# Patient Record
Sex: Female | Born: 1985 | Race: White | Hispanic: No | Marital: Married | Smoking: Never smoker
Health system: Southern US, Community
[De-identification: ages and names within clinical notes are randomized; demographics above are authoritative.]

## PROBLEM LIST (undated history)

## (undated) ENCOUNTER — Inpatient Hospital Stay (HOSPITAL_COMMUNITY): Payer: Self-pay

## (undated) DIAGNOSIS — Z789 Other specified health status: Secondary | ICD-10-CM

## (undated) DIAGNOSIS — R87629 Unspecified abnormal cytological findings in specimens from vagina: Secondary | ICD-10-CM

## (undated) HISTORY — PX: WISDOM TOOTH EXTRACTION: SHX21

## (undated) HISTORY — PX: OTHER SURGICAL HISTORY: SHX169

## (undated) HISTORY — PX: TONSILLECTOMY: SUR1361

---

## 2010-03-01 ENCOUNTER — Encounter: Payer: Self-pay | Admitting: Emergency Medicine

## 2010-03-01 ENCOUNTER — Ambulatory Visit (INDEPENDENT_AMBULATORY_CARE_PROVIDER_SITE_OTHER): Payer: BC Managed Care – PPO | Admitting: Emergency Medicine

## 2010-03-01 DIAGNOSIS — J069 Acute upper respiratory infection, unspecified: Secondary | ICD-10-CM

## 2010-03-12 NOTE — Assessment & Plan Note (Signed)
Summary: sinus/TM(rm3)   Vital Signs:  Patient Profile:   25 Years Old Female CC:      sinus/ear pain Height:     67 inches Weight:      190.5 pounds O2 Sat:      97 % O2 treatment:    Room Air Temp:     97.3 degrees F oral Pulse rate:   83 / minute Resp:     20 per minute BP sitting:   115 / 74  (left arm) Cuff size:   regular  Vitals Entered By: Burnard Hawthorne RN (March 01, 2010 4:08 PM)                  Updated Prior Medication List: No Medications Current Allergies: No known allergies History of Present Illness Chief Complaint: sinus/ear pain History of Present Illness: 25 Years Old Female complains of onset of cold symptoms for 2 days.  Kimberly Kirk has been using no OTC meds. No sore throat No cough No pleuritic pain No wheezing + nasal congestion + post-nasal drainage + sinus pain/pressure No chest congestion No itchy/red eyes No earache No hemoptysis No SOB No chills/sweats No fever No nausea No vomiting No abdominal pain No diarrhea No skin rashes No fatigue No myalgias No headache   REVIEW OF SYSTEMS Constitutional Symptoms       Complains of fever.     Denies chills, night sweats, weight loss, weight gain, and fatigue.  Eyes       Complains of eye pain.      Denies change in vision, eye discharge, glasses, contact lenses, and eye surgery. Ear/Nose/Throat/Mouth       Complains of frequent runny nose, sinus problems, and sore throat.      Denies hearing loss/aids, change in hearing, ear pain, ear discharge, dizziness, frequent nose bleeds, hoarseness, and tooth pain or bleeding.  Respiratory       Complains of dry cough.      Denies productive cough, wheezing, shortness of breath, asthma, bronchitis, and emphysema/COPD.  Cardiovascular       Denies murmurs, chest pain, and tires easily with exhertion.    Gastrointestinal       Denies stomach pain, nausea/vomiting, diarrhea, constipation, blood in bowel movements, and  indigestion. Genitourniary       Denies painful urination, kidney stones, and loss of urinary control. Neurological       Denies paralysis, seizures, and fainting/blackouts. Musculoskeletal       Denies muscle pain, joint pain, joint stiffness, decreased range of motion, redness, swelling, muscle weakness, and gout.  Skin       Denies bruising, unusual mles/lumps or sores, and hair/skin or nail changes.  Psych       Denies mood changes, temper/anger issues, anxiety/stress, speech problems, depression, and sleep problems. Other Comments: symptoms started Friday, ear pain is better today   Past History:  Past Medical History: Unremarkable  Past Surgical History: Denies surgical history  Social History: non smoker no alcohol use no street drugs Physical Exam General appearance: well developed, well nourished, no acute distress Ears: normal, no lesions or deformities Nasal: mucosa pink, nonedematous, no septal deviation, turbinates normal Oral/Pharynx: tongue normal, posterior pharynx without erythema or exudate Chest/Lungs: no rales, wheezes, or rhonchi bilateral, breath sounds equal without effort Heart: regular rate and  rhythm, no murmur MSE: oriented to time, place, and person Assessment New Problems: UPPER RESPIRATORY INFECTION, ACUTE (ICD-465.9)   Plan New Medications/Changes: AMOXICILLIN 875 MG TABS (AMOXICILLIN) 1 by mouth  two times a day for 7 days  #14 x 0, 03/01/2010, Hoyt Koch MD  New Orders: New Patient Level III [phone] Planning Comments:   1)  Take the prescribed antibiotic as instructed.  Hold for a week to see if getting better 2)  Use nasal saline solution (over the counter) at least 3 times a day. 3)  Use over the counter decongestants like Zyrtec-D every 12 hours as needed to help with congestion. 4)  Can take tylenol every 6 hours or motrin every 8 hours for pain or fever. 5)  Follow up with your primary doctor  if no improvement in 5-7  days, sooner if increasing pain, fever, or new symptoms.    The patient and/or caregiver has been counseled thoroughly with regard to medications prescribed including dosage, schedule, interactions, rationale for use, and possible side effects and they verbalize understanding.  Diagnoses and expected course of recovery discussed and will return if not improved as expected or if the condition worsens. Patient and/or caregiver verbalized understanding.  Prescriptions: AMOXICILLIN 875 MG TABS (AMOXICILLIN) 1 by mouth two times a day for 7 days  #14 x 0   Entered and Authorized by:   Hoyt Koch MD   Signed by:   Hoyt Koch MD on 03/01/2010   Method used:   Print then Give to Patient   RxID:   [id number]   Orders Added: 1)  New Patient Level III [06301]

## 2010-05-24 ENCOUNTER — Encounter: Payer: Self-pay | Admitting: Family Medicine

## 2010-05-24 ENCOUNTER — Inpatient Hospital Stay (INDEPENDENT_AMBULATORY_CARE_PROVIDER_SITE_OTHER)
Admission: RE | Admit: 2010-05-24 | Discharge: 2010-05-24 | Disposition: A | Discharge status: Home / Self Care | Payer: BC Managed Care – PPO | Source: Ambulatory Visit | Attending: Family Medicine | Admitting: Family Medicine

## 2010-05-24 DIAGNOSIS — J029 Acute pharyngitis, unspecified: Secondary | ICD-10-CM

## 2010-05-24 LAB — CONVERTED CEMR LAB: Rapid Strep: NEGATIVE

## 2010-05-29 ENCOUNTER — Telehealth (INDEPENDENT_AMBULATORY_CARE_PROVIDER_SITE_OTHER): Payer: Self-pay | Admitting: *Deleted

## 2010-12-07 NOTE — Telephone Encounter (Signed)
  Phone Note Outgoing Call   Call placed by: Clemens Catholic LPN,  May 29, 2010 10:42 AM Call placed to: Patient Summary of Call: call back: left message with throat culture results and  to call back if she has any questions or conerns.  Initial call taken by: Clemens Catholic LPN,  May 29, 2010 10:42 AM

## 2010-12-07 NOTE — Progress Notes (Signed)
Summary: SORE THROAT,EAR ACHE/WSE(rm4)   Vital Signs:  Patient Profile:   25 Years Old Female CC:      sore throat/ ear pain Height:     67 inches Weight:      190 pounds O2 Sat:      98 % O2 treatment:    Room Air Temp:     98.6 degrees F oral Pulse rate:   56 / minute Resp:     18 per minute BP sitting:   98 / 65  (left arm) Cuff size:   regular  Pt. in pain?   yes    Location:   throat/ears    Intensity:   5/2    Type:       sore/ache  Vitals Entered By: Linton Flemings RN (May 24, 2010 11:38 AM)                   Updated Prior Medication List: No Medications Current Allergies (reviewed today): No known allergies History of Present Illness Chief Complaint: sore throat/ ear pain History of Present Illness:  Subjective: Patient complains of sore throat for 2 days. No cough No pleuritic pain No wheezing ? nasal congestion No post-nasal drainage No sinus pain/pressure No itchy/red eyes + left earache today No hemoptysis No SOB No fever/chills No nausea No vomiting No abdominal pain No diarrhea No skin rashes No fatigue No myalgias No headache    REVIEW OF SYSTEMS Constitutional Symptoms      Denies fever, chills, night sweats, weight loss, weight gain, and fatigue.  Eyes       Denies change in vision, eye pain, eye discharge, glasses, contact lenses, and eye surgery. Ear/Nose/Throat/Mouth       Complains of ear pain and sore throat.      Denies hearing loss/aids, change in hearing, ear discharge, dizziness, frequent runny nose, frequent nose bleeds, sinus problems, hoarseness, and tooth pain or bleeding.  Respiratory       Denies dry cough, productive cough, wheezing, shortness of breath, asthma, bronchitis, and emphysema/COPD.  Cardiovascular       Denies murmurs, chest pain, and tires easily with exhertion.    Gastrointestinal       Denies stomach pain, nausea/vomiting, diarrhea, constipation, blood in bowel movements, and  indigestion. Genitourniary       Denies painful urination, kidney stones, and loss of urinary control. Neurological       Denies paralysis, seizures, and fainting/blackouts. Musculoskeletal       Denies muscle pain, joint pain, joint stiffness, decreased range of motion, redness, swelling, muscle weakness, and gout.  Skin       Denies bruising, unusual mles/lumps or sores, and hair/skin or nail changes.  Psych       Denies mood changes, temper/anger issues, anxiety/stress, speech problems, depression, and sleep problems. Other Comments: symptoms started Friday   Past History:  Past Medical History: Reviewed history from 03/01/2010 and no changes required. Unremarkable  Past Surgical History: Reviewed history from 03/01/2010 and no changes required. Denies surgical history  Family History: none   Objective:  Appearance:  Patient appears healthy, stated age, and in no acute distress  Eyes:  Pupils are equal, round, and reactive to light and accomodation.  Extraocular movement is intact.  Conjunctivae are not inflamed.  Ears:  Canals normal.  Tympanic membranes normal.   Nose:  Mildly congested turbinates.  No sinus tenderness  Pharynx:  Minimal erythema Neck:  Supple.  Slightly tender shotty posterior nodes are palpated  bilaterally.  Lungs:  Clear to auscultation.  Breath sounds are equal.  Heart:  Regular rate and rhythm without murmurs, rubs, or gallops.  Abdomen:  Nontender without masses or hepatosplenomegaly.  Bowel sounds are present.  No CVA or flank tenderness.  Skin:  No rash Rapid strep test negative  Assessment New Problems: ACUTE PHARYNGITIS (ICD-462)  NO EVIDENCE BACTERIAL INFECTION TODAY.  SUSPECT EARLY VIRAL URI  Plan New Orders: Rapid Strep [16109] T-Culture, Throat [[id number]] Est. Patient Level III [99213] Services provided After hours-Weekends-Holidays [99051] Planning Comments:   Throat culture pending. Treat symptomatically for now.   Follow-up with PCP if not improving.   The patient and/or caregiver has been counseled thoroughly with regard to medications prescribed including dosage, schedule, interactions, rationale for use, and possible side effects and they verbalize understanding.  Diagnoses and expected course of recovery discussed and will return if not improved as expected or if the condition worsens. Patient and/or caregiver verbalized understanding.   Patient Instructions: 1)  May take Ibuprofen 200mg , 4 tabs every 8 hours with food for sore throat 2)  If sinus congestion and cough develop, take Mucinex D (guaifenesin with decongestant) twice daily. 3)  Increase fluid intake, rest. 4)  May use Afrin nasal spray (or generic oxymetazoline) twice daily for about 5 days.  Also recommend using saline nasal spray several times daily and/or saline nasal irrigation. 5)  If cough develops at night, recommend Delsym Cough Suppressant at bedtime. 6)  Followup with family doctor if not improving 7 to 10 days.   Orders Added: 1)  Rapid Strep [91478] 2)  T-Culture, Throat [[id number]] 3)  Est. Patient Level III [57846] 4)  Services provided After hours-Weekends-Holidays [99051]    Laboratory Results  Date/Time Received: May 24, 2010 12:01 PM  Date/Time Reported: May 24, 2010 12:01 PM   Other Tests  Rapid Strep: negative  Kit Test Internal QC: Negative   (Normal Range: Negative)

## 2011-04-03 ENCOUNTER — Emergency Department
Admission: EM | Admit: 2011-04-03 | Discharge: 2011-04-03 | Disposition: A | Discharge status: Home / Self Care | Payer: BC Managed Care – PPO | Source: Home / Self Care | Attending: Family Medicine | Admitting: Family Medicine

## 2011-04-03 DIAGNOSIS — J069 Acute upper respiratory infection, unspecified: Secondary | ICD-10-CM

## 2011-04-03 DIAGNOSIS — M940 Chondrocostal junction syndrome [Tietze]: Secondary | ICD-10-CM

## 2011-04-03 DIAGNOSIS — J029 Acute pharyngitis, unspecified: Secondary | ICD-10-CM

## 2011-04-03 LAB — POCT RAPID STREP A (OFFICE): Rapid Strep A Screen: NEGATIVE

## 2011-04-03 NOTE — ED Notes (Signed)
Patient complains of sore throat and dry cough x 3 days. Denies fever, chills or night sweats. No nausea, vomiting or diarrhea.

## 2011-04-03 NOTE — Discharge Instructions (Signed)
Take Mucinex D (guaifenesin with decongestant) twice daily for congestion.  Increase fluid intake, rest. °May use Afrin nasal spray (or generic oxymetazoline) twice daily for about 5 days.  Also recommend using saline nasal spray several times daily and saline nasal irrigation (AYR is a common brand) °Stop all antihistamines for now, and other non-prescription cough/cold preparations. °May take Ibuprofen 200mg, 4 tabs every 8 hours with food for chest/sternum discomfort. °Follow-up with family doctor if not improving 7 to 10 days.  °

## 2011-04-03 NOTE — ED Provider Notes (Signed)
History     CSN: [id number]  Arrival date & time 04/03/11  0929   First MD Initiated Contact with Patient 04/03/11 0957      Chief Complaint  Patient presents with  . Sore Throat    x 3 days      HPI Comments: Patient complains of approximately 3 day history of gradually progressive URI symptoms beginning with a mild sore throat, followed by progressive nasal congestion.  A cough started yesterday. Complains of fatigue but no myalgias.  Cough is now worse at night and generally non-productive during the day.  There has been no pleuritic pain, shortness of breath, or wheezes.   The history is provided by the patient.    History reviewed. No pertinent past medical history.  Past Surgical History  Procedure Date  . Wisdom tooth extraction   . Cesarean section     x 2    Family History  Problem Relation Age of Onset  . Diabetes Other     History  Substance Use Topics  . Smoking status: Never Smoker   . Smokeless tobacco: Never Used  . Alcohol Use: No    OB History    Grav Para Term Preterm Abortions TAB SAB Ect Mult Living                  Review of Systems + sore throat + cough No pleuritic pain No wheezing + nasal congestion ? post-nasal drainage No sinus pain/pressure No itchy/red eyes No earache No hemoptysis No SOB No fever/chills No nausea No vomiting No abdominal pain No diarrhea No urinary symptoms No skin rashes + fatigue No myalgias No headache Used OTC meds without relief  Allergies  Review of patient's allergies indicates no known allergies.  Home Medications   Current Outpatient Rx  Name Route Sig Dispense Refill  . IBUPROFEN 200 MG PO TABS Oral Take 200 mg by mouth every 6 (six) hours as needed.      BP 115/77  Pulse 84  Temp(Src) 98.3 F (36.8 C) (Oral)  Resp 16  Ht 5\' 7"  (1.702 m)  Wt 186 lb (84.369 kg)  BMI 29.13 kg/m2  SpO2 98%  Physical Exam Nursing notes and Vital Signs reviewed. Appearance:  Patient appears  healthy, stated age, and in no acute distress Eyes:  Pupils are equal, round, and reactive to light and accomodation.  Extraocular movement is intact.  Conjunctivae are not inflamed  Ears:  Canals normal.  Tympanic membranes normal.  Nose:  Mildly congested turbinates.  No sinus tenderness.   Pharynx:  Normal Neck:  Supple.  Tender shotty anterior/posterior nodes are palpated bilaterally  Lungs:  Clear to auscultation.  Breath sounds are equal.  Chest:  Distinct tenderness to palpation over the mid-sternum.  Heart:  Regular rate and rhythm without murmurs, rubs, or gallops.  Abdomen:  Nontender without masses or hepatosplenomegaly.  Bowel sounds are present.  No CVA or flank tenderness.  Extremities:  No edema.  No calf tenderness Skin:  No rash present.   ED Course  Procedures none   Labs Reviewed  POCT RAPID STREP A (OFFICE) - Normal    STREP A DNA PROBE pending      1. Acute pharyngitis   2. Acute upper respiratory infections of unspecified site   3. Costochondritis, acute       MDM  Suspect early viral URI Throat culture pending There is no evidence of bacterial infection today.  Treat symptomatically for now:Take Mucinex D (guaifenesin with  decongestant) twice daily for congestion.  Increase fluid intake, rest. May use Afrin nasal spray (or generic oxymetazoline) twice daily for about 5 days.  Also recommend using saline nasal spray several times daily and saline nasal irrigation (AYR is a common brand) Stop all antihistamines for now, and other non-prescription cough/cold preparations. May take Ibuprofen 200mg , 4 tabs every 8 hours with food for chest/sternum discomfort. Follow-up with family doctor if not improving 7 to 10 days.           Lattie Haw, MD 04/03/11 1019

## 2011-04-07 ENCOUNTER — Telehealth: Payer: Self-pay | Admitting: *Deleted

## 2011-04-07 NOTE — ED Notes (Signed)
Callback: Patient reports symptoms are improving. Neg throat culture result given.

## 2012-06-13 ENCOUNTER — Emergency Department
Admission: EM | Admit: 2012-06-13 | Discharge: 2012-06-13 | Disposition: A | Discharge status: Home / Self Care | Payer: BC Managed Care – PPO | Source: Home / Self Care | Attending: Family Medicine | Admitting: Family Medicine

## 2012-06-13 ENCOUNTER — Encounter: Payer: Self-pay | Admitting: *Deleted

## 2012-06-13 DIAGNOSIS — J069 Acute upper respiratory infection, unspecified: Secondary | ICD-10-CM

## 2012-06-13 MED ORDER — AZITHROMYCIN 250 MG PO TABS: ORAL_TABLET | ORAL | Status: DC

## 2012-06-13 MED ORDER — BENZONATATE 200 MG PO CAPS: 200.0000 mg | ORAL_CAPSULE | Freq: Every day | ORAL | Status: DC

## 2012-06-13 NOTE — ED Notes (Signed)
Kimberly Kirk c/o productive cough, and congestion with sore throat x 3 days. Denies fever. Hx of tonsillectomy.

## 2012-06-13 NOTE — ED Provider Notes (Signed)
History     CSN: [id number]  Arrival date & time 06/13/12  1325   First MD Initiated Contact with Patient 06/13/12 1359      Chief Complaint  Patient presents with  . Cough       HPI Comments: Patient complains of onset of sore throat and cough three days ago.  The sore throat has almost resolved.  She has tightness in her anterior chest.  No fevers, chills, and sweats   The history is provided by the patient.    History reviewed. No pertinent past medical history.  Past Surgical History  Procedure Laterality Date  . Wisdom tooth extraction    . Cesarean section      x 2  . Tonsillectomy      Family History  Problem Relation Age of Onset  . Diabetes Other     History  Substance Use Topics  . Smoking status: Never Smoker   . Smokeless tobacco: Never Used  . Alcohol Use: No    OB History   Grav Para Term Preterm Abortions TAB SAB Ect Mult Living                  Review of Systems + sore throat + cough No pleuritic pain ? wheezing No nasal congestion No post-nasal drainage No sinus pain/pressure No itchy/red eyes No earache No hemoptysis + SOB No fever/chills No nausea No vomiting No abdominal pain No diarrhea No urinary symptoms No skin rashes + fatigue No myalgias No headache Used OTC meds without relief  Allergies  Review of patient's allergies indicates no known allergies.  Home Medications   Current Outpatient Rx  Name  Route  Sig  Dispense  Refill  . azithromycin (ZITHROMAX Z-PAK) 250 MG tablet      Take 2 tabs today; then begin one tab once daily for 4 more days. (Rx void after 06/21/12)   6 each   0   . benzonatate (TESSALON) 200 MG capsule   Oral   Take 1 capsule (200 mg total) by mouth at bedtime.   12 capsule   0   . ibuprofen (ADVIL,MOTRIN) 200 MG tablet   Oral   Take 200 mg by mouth every 6 (six) hours as needed.           BP 105/73  Pulse 84  Temp(Src) 98.1 F (36.7 C) (Oral)  Resp 14  Ht 5\' 7"  (1.702 m)   Wt 203 lb (92.08 kg)  BMI 31.79 kg/m2  SpO2 98%  LMP 05/18/2012  Physical Exam Nursing notes and Vital Signs reviewed. Appearance:  Patient appears stated age, and in no acute distress.  Patient is obese (BMI 31.8) Eyes:  Pupils are equal, round, and reactive to light and accomodation.  Extraocular movement is intact.  Conjunctivae are not inflamed  Ears:  Canals normal.  Tympanic membranes normal.  Nose:  Mildly congested turbinates.  No sinus tenderness.   Pharynx:  Normal Neck:  Supple.   Tender shotty posterior nodes are palpated bilaterally  Lungs:  Clear to auscultation.  Breath sounds are equal. Chest:  Distinct tenderness to palpation over the mid-sternum.   Heart:  Regular rate and rhythm without murmurs, rubs, or gallops.  Abdomen:  Nontender without masses or hepatosplenomegaly.  Bowel sounds are present.  No CVA or flank tenderness.  Extremities:  No edema.  No calf tenderness Skin:  No rash present.   ED Course  Procedures  none      1. Acute upper  respiratory infections of unspecified site; suspect early viral URI       MDM  There is no evidence of bacterial infection today.   Prescription written for Benzonatate [facility]) to take at bedtime for night-time cough.  Take plain Mucinex (guaifenesin) twice daily for cough and congestion.  Increase fluid intake, rest.  May add Sudafed if sinus congestion develops. May use Afrin nasal spray (or generic oxymetazoline) twice daily for about 5 days.  Also recommend using saline nasal spray several times daily and saline nasal irrigation (AYR is a common brand) Stop all antihistamines for now, and other non-prescription cough/cold preparations. May take Ibuprofen 200mg , 4 tabs every 8 hours with food for chest/sternum discomfort. Begin Azithromycin if not improving about one week or if persistent fever develops (Given a prescription to hold, with an expiration date)  Follow-up with family doctor if not improving about 10  days.         Lattie Haw, MD 06/13/12 [phone]

## 2014-07-07 ENCOUNTER — Emergency Department
Admission: EM | Admit: 2014-07-07 | Discharge: 2014-07-07 | Disposition: A | Discharge status: Home / Self Care | Payer: BLUE CROSS/BLUE SHIELD | Source: Home / Self Care | Attending: Family Medicine | Admitting: Family Medicine

## 2014-07-07 ENCOUNTER — Encounter: Payer: Self-pay | Admitting: Emergency Medicine

## 2014-07-07 DIAGNOSIS — L03032 Cellulitis of left toe: Secondary | ICD-10-CM | POA: Diagnosis not present

## 2014-07-07 MED ORDER — CEPHALEXIN 500 MG PO CAPS: 500.0000 mg | ORAL_CAPSULE | Freq: Two times a day (BID) | ORAL | Status: DC

## 2014-07-07 NOTE — ED Notes (Signed)
C/O ingrown toenail since Thursday.

## 2014-07-07 NOTE — ED Provider Notes (Signed)
CSN: [id number]     Arrival date & time 07/07/14  1611 History   First MD Initiated Contact with Patient 07/07/14 1613     Chief Complaint  Patient presents with  . Ingrown Toenail   (Consider location/radiation/quality/duration/timing/severity/associated sxs/prior Treatment) HPI  Patient is a 29 year old female complaining of gradually worsening left great toe pain secondary to ingrown toenail. Symptoms started over one week ago. Pain is achy and sharp mild to moderate in severity worse with palpation. Patient notes worsening redness and swelling to the lateral aspect of her left great toe. States she had a pedicure done yesterday where the nail technician normally removes ingrown toenails however was not able to get the entire ingrown nail yesterday. Patient here for further evaluation. Denies bleeding or discharge. Denies fever nausea vomiting diarrhea. Denies home medications or other treatments.  History reviewed. No pertinent past medical history. Past Surgical History  Procedure Laterality Date  . Wisdom tooth extraction    . Cesarean section      x 2  . Tonsillectomy     Family History  Problem Relation Age of Onset  . Diabetes Other    History  Substance Use Topics  . Smoking status: Never Smoker   . Smokeless tobacco: Never Used  . Alcohol Use: No   OB History    No data available     Review of Systems  Constitutional: Negative for fever, chills, diaphoresis, appetite change and fatigue.  Gastrointestinal: Negative for nausea and vomiting.  Musculoskeletal: Positive for joint swelling. Negative for myalgias and arthralgias.       Left great toe  Skin: Positive for rash and wound.    Allergies  Review of patient's allergies indicates no known allergies.  Home Medications   Prior to Admission medications   Medication Sig Start Date End Date Taking? Authorizing Provider  azithromycin (ZITHROMAX Z-PAK) 250 MG tablet Take 2 tabs today; then begin one tab once daily  for 4 more days. (Rx void after 06/21/12) 06/13/12   Lattie Haw, MD  benzonatate (TESSALON) 200 MG capsule Take 1 capsule (200 mg total) by mouth at bedtime. 06/13/12   Lattie Haw, MD  cephALEXin (KEFLEX) 500 MG capsule Take 1 capsule (500 mg total) by mouth 2 (two) times daily. 07/07/14   Junius Finner, PA-C  ibuprofen (ADVIL,MOTRIN) 200 MG tablet Take 200 mg by mouth every 6 (six) hours as needed.    Historical Provider, MD   BP 116/79 mmHg  Pulse 72  Temp(Src) 98.6 F (37 C) (Oral)  Resp 16  Ht  (1.702 m)  Wt 210 lb 8 oz (95.482 kg)  BMI 32.96 kg/m2  SpO2 100%  LMP 06/23/2014 (Approximate) Physical Exam  Constitutional: She is oriented to person, place, and time. She appears well-developed and well-nourished.  HENT:  Head: Normocephalic and atraumatic.  Eyes: EOM are normal.  Neck: Normal range of motion.  Cardiovascular: Normal rate.   Left great toe: cap refill <3 seconds  Pulmonary/Chest: Effort normal.  Musculoskeletal: Normal range of motion. She exhibits edema and tenderness.  Left great toe: mild edema and erythema to lateral aspect, FROM. (see skin exam).  Neurological: She is alert and oriented to person, place, and time.  Skin: Skin is warm and dry. There is erythema.  Left great toe: new nail polish in place. Nails are cut neatly. Mild erythema and edema to lateral aspect, associated tenderness. No bleeding or discharge. No fluctuance. No red streaking.  Psychiatric: She has a normal mood  and affect. Her behavior is normal.  Nursing note and vitals reviewed.   ED Course  Procedures (including critical care time) Labs Review Labs Reviewed - No data to display  Imaging Review No results found.   MDM   1. Paronychia of great toe, left     Patient is a 29 year old female appears well, nontoxic, is afebrile, complaining of left great toe pain secondary to infected ingrown toenail. No evidence of abscess requiring I&D at this time. Will treat with  Keflex for 7 days and discuss home care treatment with warm soaks 2 to 3 times a day and encourage gentle massaging of skin to help push back skin from the nail. May take acetaminophen and ibuprofen for pain. Follow-up with primary care provider in 4-5 days if not improving. Follow-up sooner if symptoms worsening. Patient verbalized understanding and agreement with treatment plan.  Junius Finnerrin O'Malley, PA-C 07/07/14 [phone]

## 2014-07-07 NOTE — Discharge Instructions (Signed)
Infected Ingrown Toenail °An infected ingrown toenail occurs when the nail edge grows into the skin and bacteria invade the area. Symptoms include pain, tenderness, swelling, and pus drainage from the edge of the nail. Poorly fitting shoes, minor injuries, and improper cutting of the toenail may also contribute to the problem. You should cut your toenails squarely instead of rounding the edges. Do not cut them too short. Avoid tight or pointed toe shoes. Sometimes the ingrown portion of the nail must be removed. If your toenail is removed, it can take 3-4 months for it to re-grow. °HOME CARE INSTRUCTIONS  °· Soak your infected toe in warm water for 20-30 minutes, 2 to 3 times a day. °· Packing or dressings applied to the area should be changed daily. °· Take medicine as directed and finish them. °· Reduce activities and keep your foot elevated when able to reduce swelling and discomfort. Do this until the infection gets better. °· Wear sandals or go barefoot as much as possible while the infected area is sensitive. °· See your caregiver for follow-up care in 2-3 days if the infection is not better. °SEEK MEDICAL CARE IF:  °Your toe is becoming more red, swollen or painful. °MAKE SURE YOU:  °· Understand these instructions. °· Will watch your condition. °· Will get help right away if you are not doing well or get worse. °Document Released: 01/29/2004 Document Revised: 03/15/2011 Document Reviewed: 12/18/2007 °ExitCare® Patient Information ©2015 [employer]. This information is not intended to replace advice given to you by your health care provider. Make sure you discuss any questions you have with your health care provider. ° °

## 2014-07-13 ENCOUNTER — Encounter: Payer: Self-pay | Admitting: Emergency Medicine

## 2014-07-13 ENCOUNTER — Emergency Department
Admission: EM | Admit: 2014-07-13 | Discharge: 2014-07-13 | Disposition: A | Discharge status: Home / Self Care | Payer: BLUE CROSS/BLUE SHIELD | Source: Home / Self Care | Attending: Family Medicine | Admitting: Family Medicine

## 2014-07-13 DIAGNOSIS — L03032 Cellulitis of left toe: Secondary | ICD-10-CM | POA: Diagnosis not present

## 2014-07-13 DIAGNOSIS — L600 Ingrowing nail: Secondary | ICD-10-CM | POA: Diagnosis not present

## 2014-07-13 MED ORDER — DOXYCYCLINE HYCLATE 100 MG PO CAPS: 100.0000 mg | ORAL_CAPSULE | Freq: Two times a day (BID) | ORAL | Status: DC

## 2014-07-13 NOTE — Discharge Instructions (Signed)
Infected Ingrown Toenail °An infected ingrown toenail occurs when the nail edge grows into the skin and bacteria invade the area. Symptoms include pain, tenderness, swelling, and pus drainage from the edge of the nail. Poorly fitting shoes, minor injuries, and improper cutting of the toenail may also contribute to the problem. You should cut your toenails squarely instead of rounding the edges. Do not cut them too short. Avoid tight or pointed toe shoes. Sometimes the ingrown portion of the nail must be removed. If your toenail is removed, it can take 3-4 months for it to re-grow. °HOME CARE INSTRUCTIONS  °· Soak your infected toe in warm water for 20-30 minutes, 2 to 3 times a day. °· Packing or dressings applied to the area should be changed daily. °· Take medicine as directed and finish them. °· Reduce activities and keep your foot elevated when able to reduce swelling and discomfort. Do this until the infection gets better. °· Wear sandals or go barefoot as much as possible while the infected area is sensitive. °· See your caregiver for follow-up care in 2-3 days if the infection is not better. °SEEK MEDICAL CARE IF:  °Your toe is becoming more red, swollen or painful. °MAKE SURE YOU:  °· Understand these instructions. °· Will watch your condition. °· Will get help right away if you are not doing well or get worse. °Document Released: 01/29/2004 Document Revised: 03/15/2011 Document Reviewed: 12/18/2007 °ExitCare® Patient Information ©2015 [employer]. This information is not intended to replace advice given to you by your health care provider. Make sure you discuss any questions you have with your health care provider. ° °

## 2014-07-13 NOTE — ED Notes (Signed)
[facility] with C/O ingrown toenail for 10 days seen here last Sunday 07/07/2014 for the same. Left great toe red with slight edema.

## 2014-07-13 NOTE — ED Notes (Signed)
[place] [facility]. Doxycycline 100 mgs BID for 7 days per Junius FinnerErin O'Malley

## 2014-07-13 NOTE — ED Provider Notes (Signed)
CSN: [id number]     Arrival date & time 07/13/14  0909 History   First MD Initiated Contact with Patient 07/13/14 [phone]     Chief Complaint  Patient presents with  . Ingrown Toenail   (Consider location/radiation/quality/duration/timing/severity/associated sxs/prior Treatment) HPI Patient is a 29 year old female presenting to urgent care with complaint of persistent left great toe pain redness and swelling.  Patient was seen on 07/07/14 for same.  Patient was started on Keflex and advised to do warm soaks at home.  Patient has been doing as advised.  States she does have to wear closed toe shoes at work and when she gets home from work the bandage she has on does have small amount of yellow bloody drainage on it.  Pain is achy and sore, 4/10 at worst.  No pain medication taken prior to arrival.  Denies fevers, chills, nausea, vomiting, diarrhea.  Patient has not followed up with PCP or podiatrist.  History reviewed. No pertinent past medical history. Past Surgical History  Procedure Laterality Date  . Wisdom tooth extraction    . Cesarean section      x 2  . Tonsillectomy     Family History  Problem Relation Age of Onset  . Diabetes Other    History  Substance Use Topics  . Smoking status: Never Smoker   . Smokeless tobacco: Never Used  . Alcohol Use: No   OB History    No data available     Review of Systems  Constitutional: Negative for fever and chills.  Respiratory: Negative for cough and shortness of breath.   Musculoskeletal: Positive for myalgias, joint swelling, arthralgias and gait problem.       Left great toe  Skin: Positive for color change and wound. Negative for pallor and rash.  Neurological: Negative for weakness and numbness.    Allergies  Review of patient's allergies indicates no known allergies.  Home Medications   Prior to Admission medications   Medication Sig Start Date End Date Taking? Authorizing Provider  azithromycin (ZITHROMAX Z-PAK) 250 MG  tablet Take 2 tabs today; then begin one tab once daily for 4 more days. (Rx void after 06/21/12) 06/13/12   Lattie HawStephen A Beese, MD  benzonatate (TESSALON) 200 MG capsule Take 1 capsule (200 mg total) by mouth at bedtime. 06/13/12   Lattie HawStephen A Beese, MD  cephALEXin (KEFLEX) 500 MG capsule Take 1 capsule (500 mg total) by mouth 2 (two) times daily. 07/07/14   Junius FinnerErin O'Malley, PA-C  doxycycline (VIBRAMYCIN) 100 MG capsule Take 1 capsule (100 mg total) by mouth 2 (two) times daily. One po bid x 7 days 07/13/14   Junius FinnerErin O'Malley, PA-C  ibuprofen (ADVIL,MOTRIN) 200 MG tablet Take 200 mg by mouth every 6 (six) hours as needed.    Historical Provider, MD   BP 104/72 mmHg  Pulse 82  Temp(Src) 98.5 F (36.9 C) (Oral)  Resp 16  Ht 5\' 7"  (1.702 m)  Wt 210 lb 12 oz (95.596 kg)  BMI 33.00 kg/m2  SpO2 99%  LMP 06/23/2014 (Approximate) Physical Exam  Constitutional: She is oriented to person, place, and time. She appears well-developed and well-nourished.  HENT:  Head: Normocephalic and atraumatic.  Eyes: EOM are normal.  Neck: Normal range of motion.  Cardiovascular: Normal rate.   Left great toe: Cap refill less than 3 seconds.  Pulmonary/Chest: Effort normal.  Musculoskeletal: Normal range of motion. She exhibits edema and tenderness.  Left great toe: Full range of motion .  (See skin  exam)  Neurological: She is alert and oriented to person, place, and time.  Skin: Skin is warm and dry. There is erythema.  Left great toe: Mild erythema and edema to lateral aspect with dried did skin flap over lateral aspect of nail.  Mild tenderness.  Nail is well trimmed, appears clean.  No active bleeding or discharge.  Mild fluctuance.  No induration.  Psychiatric: She has a normal mood and affect. Her behavior is normal.  Nursing note and vitals reviewed.   ED Course  INCISION AND DRAINAGE Date/Time: 07/13/2014 10:22 AM Performed by: Junius Finner Authorized by: Donna Christen A Consent: Verbal consent  obtained. Risks and benefits: risks, benefits and alternatives were discussed Consent given by: patient Patient understanding: patient states understanding of the procedure being performed Patient consent: the patient's understanding of the procedure matches consent given Procedure consent: procedure consent matches procedure scheduled Relevant documents: relevant documents present and verified Site marked: the operative site was marked Required items: required blood products, implants, devices, and special equipment available Patient identity confirmed: verbally with patient Time out: Immediately prior to procedure a "time out" was called to verify the correct patient, procedure, equipment, support staff and site/side marked as required. Type: abscess Body area: lower extremity Location details: left big toe Anesthesia: local infiltration Local anesthetic: lidocaine 1% without epinephrine and topical anesthetic Anesthetic total: 1 ml Patient sedated: no Scalpel size: 11 Needle gauge: 25. Incision type: single straight (small amount of dead skin flap debrided) Complexity: simple Drainage: bloody Drainage amount: scant Wound treatment: wound left open Packing material: none Patient tolerance: Patient tolerated the procedure well with no immediate complications     Labs Review Labs Reviewed - No data to display  Imaging Review No results found.   MDM   1. Ingrowing toenail with infection      Patient is a 29 year old female presenting to urgent care for second time within last 1 week for left great toe pain.  Exam consistent with infected ingrown toenail.  I&D was performed with some debridement of skin.  See procedure note above.  Nail appears clean, and healthy. Doxycycline was called into pharmacy.  Encouraged to continue warm soaks and change banded several times a day.  Follow-up with podiatry or PCP in one week if not improving. Patient verbalized understanding and  agreement with treatment plan.    Junius Finner, PA-C 07/13/14 1025

## 2014-07-13 NOTE — ED Notes (Signed)
Dry sterile applied

## 2014-12-26 ENCOUNTER — Emergency Department
Admission: EM | Admit: 2014-12-26 | Discharge: 2014-12-26 | Disposition: A | Discharge status: Home / Self Care | Payer: BLUE CROSS/BLUE SHIELD | Source: Home / Self Care | Attending: Family Medicine | Admitting: Family Medicine

## 2014-12-26 DIAGNOSIS — L02213 Cutaneous abscess of chest wall: Secondary | ICD-10-CM

## 2014-12-26 MED ORDER — CEPHALEXIN 500 MG PO CAPS: 500.0000 mg | ORAL_CAPSULE | Freq: Two times a day (BID) | ORAL | Status: DC

## 2014-12-26 NOTE — Discharge Instructions (Signed)
°  Abscess An abscess is an infected area that contains a collection of pus and debris.It can occur in almost any part of the body. An abscess is also known as a furuncle or boil. CAUSES  An abscess occurs when tissue gets infected. This can occur from blockage of oil or sweat glands, infection of hair follicles, or a minor injury to the skin. As the body tries to fight the infection, pus collects in the area and creates pressure under the skin. This pressure causes pain. People with weakened immune systems have difficulty fighting infections and get certain abscesses more often.  SYMPTOMS Usually an abscess develops on the skin and becomes a painful mass that is red, warm, and tender. If the abscess forms under the skin, you may feel a moveable soft area under the skin. Some abscesses break open (rupture) on their own, but most will continue to get worse without care. The infection can spread deeper into the body and eventually into the bloodstream, causing you to feel ill.  DIAGNOSIS  Your caregiver will take your medical history and perform a physical exam. A sample of fluid may also be taken from the abscess to determine what is causing your infection. TREATMENT  Your caregiver may prescribe antibiotic medicines to fight the infection. However, taking antibiotics alone usually does not cure an abscess. Your caregiver may need to make a small cut (incision) in the abscess to drain the pus. In some cases, gauze is packed into the abscess to reduce pain and to continue draining the area. HOME CARE INSTRUCTIONS   You may take acetaminophen and ibuprofen as needed for pain  Keep wound clean with soap and water, change bandage 2-3 times a day, more frequent if bleeding or discharge worsens.  Use a warm compress such as a warm damp wash cloth 3-4 times a day for 15-20 minutes at a time.  You may use a heating pad instead.  If you were prescribed antibiotics, take them as directed. Finish them even if  you start to feel better.  If gauze is used, follow your caregiver's directions for changing the gauze.  To avoid spreading the infection:  Keep your draining abscess covered with a bandage.  Wash your hands well.  Do not share personal care items, towels, or whirlpools with others.  Avoid skin contact with others.  Keep your skin and clothes clean around the abscess.  Keep all follow-up appointments as directed by your caregiver. SEEK MEDICAL CARE IF:   You have increased pain, swelling, redness, fluid drainage, or bleeding.  You have muscle aches, chills, or a general ill feeling.  You have a fever. MAKE SURE YOU:   Understand these instructions.  Will watch your condition.  Will get help right away if you are not doing well or get worse.   This information is not intended to replace advice given to you by your health care provider. Make sure you discuss any questions you have with your health care provider.   Document Released: 09/30/2004 Document Revised: 06/22/2011 Document Reviewed: 03/05/2011 Elsevier Interactive Patient Education [employer]2016 [employer] Inc.

## 2014-12-26 NOTE — ED Provider Notes (Signed)
CSN: [id number]     Arrival date & time 12/26/14  1708 History   First MD Initiated Contact with Patient 12/26/14 1714     Chief Complaint  Patient presents with  . Sore    Left Breast   (Consider location/radiation/quality/duration/timing/severity/associated sxs/prior Treatment) HPI Pt is a 29[facility]/o a pimple type lesion on the lateral aspect of her Left breast that started 2 days ago.  Last night, after waking to go to the bathroom, she noticed the area had popped and it was bleeding. Denies seeing any pus.  Area is sore to touch.  She reports mild bruising and redness to the lateral aspect of left side of her breast. Denies fever, chills, n/v/d. No hx of similar sores. Denies nipple discharge or pain.   History reviewed. No pertinent past medical history. Past Surgical History  Procedure Laterality Date  . Wisdom tooth extraction    . Cesarean section      x 2  . Tonsillectomy     Family History  Problem Relation Age of Onset  . Diabetes Other    Social History  Substance Use Topics  . Smoking status: Never Smoker   . Smokeless tobacco: Never Used  . Alcohol Use: No   OB History    No data available     Review of Systems  Constitutional: Negative for fever and chills.  Gastrointestinal: Negative for nausea, vomiting and diarrhea.  Musculoskeletal: Negative for myalgias and arthralgias.  Skin: Positive for color change and wound.    Allergies  Review of patient's allergies indicates no known allergies.  Home Medications   Prior to Admission medications   Medication Sig Start Date End Date Taking? Authorizing Provider  benzonatate (TESSALON) 200 MG capsule Take 1 capsule (200 mg total) by mouth at bedtime. 06/13/12   Lattie HawStephen A Beese, MD  cephALEXin (KEFLEX) 500 MG capsule Take 1 capsule (500 mg total) by mouth 2 (two) times daily. For 7 days 12/26/14   Junius FinnerErin O'Malley, PA-C  ibuprofen (ADVIL,MOTRIN) 200 MG tablet Take 200 mg by mouth every  6 (six) hours as needed.    Historical Provider, MD   Meds Ordered and Administered this Visit  Medications - No data to display  BP 127/87 mmHg  Pulse 86  Temp(Src) 97.9 F (36.6 C) (Oral)  Ht 5\' 7"  (1.702 m)  Wt 218 lb 8 oz (99.111 kg)  BMI 34.21 kg/m2  SpO2 99%  LMP 12/12/2014 (Approximate) No data found.   Physical Exam  Constitutional: She is oriented to person, place, and time. She appears well-developed and well-nourished.  HENT:  Head: Normocephalic and atraumatic.  Eyes: EOM are normal.  Neck: Normal range of motion.  Cardiovascular: Normal rate.   Pulmonary/Chest: Effort normal. Left breast exhibits skin change and tenderness. Left breast exhibits no inverted nipple, no mass and no nipple discharge.    Quarter sized circular area of erythema with centralized bleeding on Lateral aspect of Left breast. No induration of fluctuance. Tender to touch. No red streaking. No masses palpated.   Musculoskeletal: Normal range of motion.  Neurological: She is alert and oriented to person, place, and time.  Skin: Skin is warm and dry.  Psychiatric: She has a normal mood and affect. Her behavior is normal.  Nursing note and vitals reviewed.   ED Course  Procedures (including critical care time)  Labs Review Labs Reviewed  WOUND CULTURE    Imaging Review No results found.    MDM  1. Cutaneous abscess of chest wall    Pt c/o lesion on lateral aspect of Left breast.  Exam c/w early abscess. Bloody discharge noted on exam with manual palpation and drainage.  Wound culture sent. Bandage applied to wound.  Will tx with Keflex due to lack of purulent discharge.  Advised pt additional antibiotics may be called in if wound culture grows out MRSA or symptoms not improving.  Home care instructions for warm compresses provided. May take acetaminophen and ibuprofen as needed for pain.  F/u with PCP in 3-4 days if not improving, sooner if worsening. Patient verbalized  understanding and agreement with treatment plan.    Junius Finner, PA-C 12/26/14 [phone]

## 2014-12-26 NOTE — ED Notes (Signed)
What she thought was a pimple on left breast is now large, red, bruise, and draining.  Went to the bathroom during the night last night and it had popped.  Sore to the touch, and draining bloody drainage.

## 2014-12-29 LAB — WOUND CULTURE
Gram Stain: NONE SEEN
Gram Stain: NONE SEEN

## 2014-12-30 ENCOUNTER — Telehealth: Payer: Self-pay | Admitting: Emergency Medicine

## 2015-06-26 ENCOUNTER — Encounter: Payer: Self-pay | Admitting: Emergency Medicine

## 2015-06-26 ENCOUNTER — Emergency Department
Admission: EM | Admit: 2015-06-26 | Discharge: 2015-06-26 | Disposition: A | Discharge status: Home / Self Care | Payer: BLUE CROSS/BLUE SHIELD | Source: Home / Self Care | Attending: Family Medicine | Admitting: Family Medicine

## 2015-06-26 DIAGNOSIS — L729 Follicular cyst of the skin and subcutaneous tissue, unspecified: Secondary | ICD-10-CM

## 2015-06-26 DIAGNOSIS — L089 Local infection of the skin and subcutaneous tissue, unspecified: Secondary | ICD-10-CM

## 2015-06-26 MED ORDER — DOXYCYCLINE HYCLATE 100 MG PO CAPS: 100.0000 mg | ORAL_CAPSULE | Freq: Two times a day (BID) | ORAL | Status: DC

## 2015-06-26 NOTE — ED Notes (Signed)
Patient reports noticing a lump on back of left shoulder increasing in size and tenderness over past 2 weeks; no drainage.

## 2015-06-26 NOTE — ED Provider Notes (Signed)
CSN: [id number]     Arrival date & time 06/26/15  1837 History   First MD Initiated Contact with Patient 06/26/15 1839     Chief Complaint  Patient presents with  . Mass   (Consider location/radiation/quality/duration/timing/severity/associated sxs/prior Treatment) HPI Kimberly Kirk is a 30 y.o. female presenting to [facility] with c/o gradually worsening painful bump on the back of her Left shoulder. The bump has been there for a few weeks, she was seen by her PCP who reassured her it was a simple cyst but if it ever became enlarged or painful to have it evaluated again. Pain is intermittent, worse with palpation. Denies fever, chills, n/v/d. No bleeding or drainage from the bump.  No prior hx of abscesses.     History reviewed. No pertinent past medical history. Past Surgical History  Procedure Laterality Date  . Wisdom tooth extraction    . Cesarean section      x 2  . Tonsillectomy     Family History  Problem Relation Age of Onset  . Diabetes Other    Social History  Substance Use Topics  . Smoking status: Never Smoker   . Smokeless tobacco: Never Used  . Alcohol Use: No   OB History    No data available     Review of Systems  Constitutional: Negative for fever and chills.  Gastrointestinal: Negative for nausea and vomiting.  Musculoskeletal: Negative for myalgias and arthralgias.  Skin: Positive for wound ( "lump" on back Left shoulder). Negative for color change and rash.    Allergies  Review of patient's allergies indicates no known allergies.  Home Medications   Prior to Admission medications   Medication Sig Start Date End Date Taking? Authorizing Provider  benzonatate (TESSALON) 200 MG capsule Take 1 capsule (200 mg total) by mouth at bedtime. 06/13/12   Lattie HawStephen A Beese, MD  cephALEXin (KEFLEX) 500 MG capsule Take 1 capsule (500 mg total) by mouth 2 (two) times daily. For 7 days 12/26/14   Junius FinnerErin O'Malley, PA-C  doxycycline (VIBRAMYCIN) 100 MG capsule Take 1 capsule  (100 mg total) by mouth 2 (two) times daily. One po bid x 7 days 06/26/15   Junius FinnerErin O'Malley, PA-C  ibuprofen (ADVIL,MOTRIN) 200 MG tablet Take 200 mg by mouth every 6 (six) hours as needed.    Historical Provider, MD   Meds Ordered and Administered this Visit  Medications - No data to display  BP 117/78 mmHg  Pulse 73  Temp(Src) 98.5 F (36.9 C) (Oral)  Resp 16  Ht 5\' 7"  (1.702 m)  Wt 205 lb (92.987 kg)  BMI 32.10 kg/m2  SpO2 97%  LMP 05/15/2015 (Approximate) No data found.   Physical Exam  Constitutional: She is oriented to person, place, and time. She appears well-developed and well-nourished.  HENT:  Head: Normocephalic and atraumatic.  Eyes: EOM are normal.  Neck: Normal range of motion.  Cardiovascular: Normal rate.   Pulmonary/Chest: Effort normal.  Musculoskeletal: Normal range of motion.  Neurological: She is alert and oriented to person, place, and time.  Skin: Skin is warm and dry.  Left posterior shoulder: 2cm area of induration and tenderness. No erythema or warmth. Mass more c/w cyst. No bleeding or drainage.   Psychiatric: She has a normal mood and affect. Her behavior is normal.  Nursing note and vitals reviewed.   ED Course  Procedures (including critical care time)  Labs Review Labs Reviewed - No data to display  Imaging Review No results found.   MDM  1. Infected cyst of skin    Exam c/w infected sebaceous cyst.  No "head" to the cyst. Will attempt treating with warm compresses and Doxycycline.  If not improving in 3-4 days, encouraged to f/u with PCP or return to UC if needed for further evaluation as it may need to be drained.  Pt info on cysts and abscesses provided. Pt denies concern for pregnancy or attempting to become pregnant. Rx: Doxycycline    Junius Finnerrin O'Malley, PA-C 06/26/15 1904

## 2015-06-26 NOTE — Discharge Instructions (Signed)
You should use a damp warm washcloth or heating pad (be careful heating pad is not too hot, be careful not to burn your skin if using a heating pad) 15-20 minutes at a time 3-4 times a day, and/or let warm water in shower run over lump to help with the infection.

## 2016-01-13 ENCOUNTER — Encounter: Payer: Self-pay | Admitting: *Deleted

## 2016-01-13 ENCOUNTER — Emergency Department (INDEPENDENT_AMBULATORY_CARE_PROVIDER_SITE_OTHER)
Admission: EM | Admit: 2016-01-13 | Discharge: 2016-01-13 | Disposition: A | Discharge status: Home / Self Care | Payer: BLUE CROSS/BLUE SHIELD | Source: Home / Self Care | Attending: Family Medicine | Admitting: Family Medicine

## 2016-01-13 DIAGNOSIS — J020 Streptococcal pharyngitis: Secondary | ICD-10-CM

## 2016-01-13 DIAGNOSIS — R0981 Nasal congestion: Secondary | ICD-10-CM

## 2016-01-13 LAB — POCT RAPID STREP A (OFFICE): Rapid Strep A Screen: POSITIVE — AB

## 2016-01-13 MED ORDER — AMOXICILLIN 500 MG PO CAPS: 500.0000 mg | ORAL_CAPSULE | Freq: Two times a day (BID) | ORAL | 0 refills | Status: DC

## 2016-01-13 NOTE — ED Triage Notes (Signed)
Patient c/o sore throat x 2-3 days. She has has nasal congestion for 1 week. Co-workers + for strep. Taken Advil cold otc.

## 2016-01-13 NOTE — ED Provider Notes (Signed)
CSN: [id number]     Arrival date & time 01/13/16  1551 History   First MD Initiated Contact with Patient 01/13/16 1651     Chief Complaint  Patient presents with  . Sore Throat  . Nasal Congestion   (Consider location/radiation/quality/duration/timing/severity/associated sxs/prior Treatment) HPI  Kimberly Kirk is a 31 y.o. female presenting to UC with c/o 2-3 days of sore throat that was preceded by nasal congestion for 1 week.  Several co-workers tested positive for strep throat. She has taken Advil cold with mild relief. Denies fever, chills, n/v/d.    History reviewed. No pertinent past medical history. Past Surgical History:  Procedure Laterality Date  . CESAREAN SECTION     x 2  . TONSILLECTOMY    . WISDOM TOOTH EXTRACTION     Family History  Problem Relation Age of Onset  . Diabetes Other    Social History  Substance Use Topics  . Smoking status: Never Smoker  . Smokeless tobacco: Never Used  . Alcohol use No   OB History    No data available     Review of Systems  Constitutional: Negative for chills and fever.  HENT: Positive for congestion, postnasal drip, rhinorrhea and sore throat. Negative for ear pain, trouble swallowing and voice change.   Respiratory: Negative for cough and shortness of breath.   Cardiovascular: Negative for chest pain and palpitations.  Gastrointestinal: Negative for abdominal pain, diarrhea, nausea and vomiting.  Musculoskeletal: Negative for arthralgias, back pain and myalgias.  Skin: Negative for rash.    Allergies  Patient has no known allergies.  Home Medications   Prior to Admission medications   Medication Sig Start Date End Date Taking? Authorizing Provider  amoxicillin (AMOXIL) 500 MG capsule Take 1 capsule (500 mg total) by mouth 2 (two) times daily. For 10 days 01/13/16   Junius Finner, PA-C   Meds Ordered and Administered this Visit  Medications - No data to display  BP 100/67 (BP Location: Left Arm)   Pulse 72   Temp  97.6 F (36.4 C) (Oral)   Resp 14   Wt 201 lb (91.2 kg)   LMP 01/12/2016   SpO2 100%   BMI 31.48 kg/m  No data found.   Physical Exam  Constitutional: She is oriented to person, place, and time. She appears well-developed and well-nourished. No distress.  HENT:  Head: Normocephalic and atraumatic.  Right Ear: Tympanic membrane normal.  Left Ear: Tympanic membrane normal.  Nose: Nose normal.  Mouth/Throat: Uvula is midline and mucous membranes are normal. Posterior oropharyngeal erythema present. No oropharyngeal exudate, posterior oropharyngeal edema or tonsillar abscesses.  Eyes: EOM are normal.  Neck: Normal range of motion. Neck supple.  Cardiovascular: Normal rate and regular rhythm.   Pulmonary/Chest: Effort normal. No stridor. No respiratory distress. She has no wheezes. She has no rales.  Musculoskeletal: Normal range of motion.  Lymphadenopathy:    She has cervical adenopathy.  Neurological: She is alert and oriented to person, place, and time.  Skin: Skin is warm and dry. She is not diaphoretic.  Psychiatric: She has a normal mood and affect. Her behavior is normal.  Nursing note and vitals reviewed.   Urgent Care Course   Clinical Course     Procedures (including critical care time)  Labs Review Labs Reviewed  POCT RAPID STREP A (OFFICE) - Abnormal; Notable for the following:       Result Value   Rapid Strep A Screen Positive (*)    All other  components within normal limits    Imaging Review No results found.   MDM   1. Strep pharyngitis   2. Nasal congestion    Pt c/o sore throat and nasal congestion. Exposure to strep.  Rapid strep: POSITIVE  Rx: amoxicillin F/u with PCP in 1 week as needed.    Junius Finnerrin O'Malley, PA-C 01/13/16 1821

## 2016-03-17 ENCOUNTER — Encounter: Payer: Self-pay | Admitting: Emergency Medicine

## 2016-03-17 ENCOUNTER — Emergency Department
Admission: EM | Admit: 2016-03-17 | Discharge: 2016-03-17 | Disposition: A | Discharge status: Home / Self Care | Payer: BLUE CROSS/BLUE SHIELD | Source: Home / Self Care | Attending: Family Medicine | Admitting: Family Medicine

## 2016-03-17 DIAGNOSIS — J029 Acute pharyngitis, unspecified: Secondary | ICD-10-CM | POA: Diagnosis not present

## 2016-03-17 LAB — POCT RAPID STREP A (OFFICE): Rapid Strep A Screen: NEGATIVE

## 2016-03-17 MED ORDER — PENICILLIN V POTASSIUM 500 MG PO TABS: ORAL_TABLET | ORAL | 0 refills | Status: DC

## 2016-03-17 NOTE — ED Provider Notes (Signed)
[facility]    CSN: [id number] Arrival date & time: 03/17/16  0800     History   Chief Complaint Chief Complaint  Patient presents with  . Sore Throat    HPI Kimberly Kirk is a 31 y.o. female.   Patient has had nasal congestion for about 5 to 7 days.  Two days ago she developed increasing sore throat and right earache.  She has also been more fatigued.  No cough or fever.  Her daughter has been diagnosed with strep pharyngitis.   The history is provided by the patient.    History reviewed. No pertinent past medical history.  There are no active problems to display for this patient.   Past Surgical History:  Procedure Laterality Date  . CESAREAN SECTION     x 2  . TONSILLECTOMY    . WISDOM TOOTH EXTRACTION      OB History    No data available       Home Medications    Prior to Admission medications   Medication Sig Start Date End Date Taking? Authorizing Provider  penicillin v potassium (VEETID) 500 MG tablet Take one tab by mouth twice daily for 10 days (Rx void after 03/25/16) 03/17/16   Lattie HawStephen A Fredonia Casalino, MD    Family History Family History  Problem Relation Age of Onset  . Diabetes Other     Social History Social History  Substance Use Topics  . Smoking status: Never Smoker  . Smokeless tobacco: Never Used  . Alcohol use No     Allergies   Patient has no known allergies.   Review of Systems Review of Systems + sore throat No cough No pleuritic pain No wheezing + nasal congestion + post-nasal drainage No sinus pain/pressure No itchy/red eyes ? Right earache No hemoptysis No SOB No fever/chills No nausea No vomiting No abdominal pain No diarrhea No urinary symptoms No skin rash + fatigue No myalgias No headache Used OTC meds without relief   Physical Exam Triage Vital Signs ED Triage Vitals  Enc Vitals Group     BP 03/17/16 0832 106/73     Pulse Rate 03/17/16 0832 86     Resp --      Temp 03/17/16 0832 97.5  F (36.4 C)     Temp Source 03/17/16 0832 Oral     SpO2 03/17/16 0832 98 %     Weight 03/17/16 0832 192 lb (87.1 kg)     Height --      Head Circumference --      Peak Flow --      Pain Score 03/17/16 0833 3     Pain Loc --      Pain Edu? --      Excl. in GC? --    No data found.   Updated Vital Signs BP 106/73 (BP Location: Right Arm)   Pulse 86   Temp 97.5 F (36.4 C) (Oral)   Wt 192 lb (87.1 kg)   SpO2 98%   BMI 30.07 kg/m   Visual Acuity Right Eye Distance:   Left Eye Distance:   Bilateral Distance:    Right Eye Near:   Left Eye Near:    Bilateral Near:     Physical Exam Nursing notes and Vital Signs reviewed. Appearance:  Patient appears stated age, and in no acute distress Eyes:  Pupils are equal, round, and reactive to light and accomodation.  Extraocular movement is intact.  Conjunctivae are not inflamed  Ears:  Canals normal.  Tympanic membranes normal.  Nose:  Mildly congested turbinates.  No sinus tenderness.  Pharynx:  Minimal erythema posteriorly. Neck:  Supple.  Tender enlarged posterior/lateral nodes are palpated bilaterally  Lungs:  Clear to auscultation.  Breath sounds are equal.  Moving air well. Heart:  Regular rate and rhythm without murmurs, rubs, or gallops.  Abdomen:  Nontender without masses or hepatosplenomegaly.  Bowel sounds are present.  No CVA or flank tenderness.  Extremities:  No edema.  Skin:  No rash present.    UC Treatments / Results  Labs (all labs ordered are listed, but only abnormal results are displayed) Labs Reviewed  STREP A DNA PROBE  POCT RAPID STREP A (OFFICE) negative    EKG  EKG Interpretation None       Radiology No results found.  Procedures Procedures (including critical care time)  Medications Ordered in UC Medications - No data to display   Initial Impression / Assessment and Plan / UC Course  I have reviewed the triage vital signs and the nursing notes.  Pertinent labs & imaging results  that were available during my care of the patient were reviewed by me and considered in my medical decision making (see chart for details).    Suspect early viral URI.  CENTOR 1 Throat culture pending. Begin penicillin if throat culture positive (given Rx to hold). May take Ibuprofen 200mg , 4 tabs every 8 hours with food for sore throat.  In increasing cold symptoms develop, try the following: Take plain guaifenesin (1200mg  extended release tabs such as Mucinex) twice daily, with plenty of water, for cough and congestion.  May add Pseudoephedrine (30mg , one or two every 4 to 6 hours) for sinus congestion.  Get adequate rest.   May take Delsym Cough Suppressant at bedtime for nighttime cough.  Try warm salt water gargles for sore throat.  Stop all antihistamines for now, and other non-prescription cough/cold preparations.   Follow-up with family doctor if not improving about 7 to 10 days.     Final Clinical Impressions(s) / UC Diagnoses   Final diagnoses:  Acute pharyngitis, unspecified etiology    New Prescriptions New Prescriptions   PENICILLIN V POTASSIUM (VEETID) 500 MG TABLET    Take one tab by mouth twice daily for 10 days (Rx void after 03/25/16)     Lattie Haw, MD 03/17/16 [phone]

## 2016-03-17 NOTE — Discharge Instructions (Signed)
Begin penicillin if throat culture positive. May take Ibuprofen 200mg , 4 tabs every 8 hours with food for sore throat.  In increasing cold symptoms develop, try the following: Take plain guaifenesin (1200mg  extended release tabs such as Mucinex) twice daily, with plenty of water, for cough and congestion.  May add Pseudoephedrine (30mg , one or two every 4 to 6 hours) for sinus congestion.  Get adequate rest.   May take Delsym Cough Suppressant at bedtime for nighttime cough.  Try warm salt water gargles for sore throat.  Stop all antihistamines for now, and other non-prescription cough/cold preparations.   Follow-up with family doctor if not improving about 7 to 10 days.

## 2016-03-17 NOTE — ED Triage Notes (Signed)
Pt c/o sore throat and right ear pain x2 days. Denies fever. States daughter dx with strep last week.

## 2016-03-18 ENCOUNTER — Telehealth: Payer: Self-pay | Admitting: Emergency Medicine

## 2016-03-18 LAB — STREP A DNA PROBE: GASP: NOT DETECTED

## 2016-03-18 NOTE — Telephone Encounter (Signed)
Strep culture was negative, hope she is feeling better

## 2016-07-09 ENCOUNTER — Encounter: Payer: Self-pay | Admitting: *Deleted

## 2016-07-09 ENCOUNTER — Emergency Department: Payer: BLUE CROSS/BLUE SHIELD

## 2016-07-09 ENCOUNTER — Telehealth: Payer: Self-pay | Admitting: Emergency Medicine

## 2016-07-09 ENCOUNTER — Emergency Department
Admission: EM | Admit: 2016-07-09 | Discharge: 2016-07-09 | Disposition: A | Discharge status: Home / Self Care | Payer: BLUE CROSS/BLUE SHIELD | Source: Home / Self Care | Attending: Family Medicine | Admitting: Family Medicine

## 2016-07-09 DIAGNOSIS — R1030 Lower abdominal pain, unspecified: Secondary | ICD-10-CM | POA: Diagnosis not present

## 2016-07-09 LAB — POCT URINALYSIS DIP (MANUAL ENTRY)
Bilirubin, UA: NEGATIVE
Blood, UA: NEGATIVE
Glucose, UA: NEGATIVE mg/dL
LEUKOCYTES UA: NEGATIVE
Nitrite, UA: NEGATIVE
Protein Ur, POC: NEGATIVE mg/dL
Spec Grav, UA: 1.01 (ref 1.010–1.025)
Urobilinogen, UA: 0.2 E.U./dL
pH, UA: 6.5 (ref 5.0–8.0)

## 2016-07-09 LAB — POCT URINE PREGNANCY: Preg Test, Ur: NEGATIVE

## 2016-07-09 NOTE — ED Triage Notes (Signed)
Patient c/o left lower pelvic pain x 1pm today. Denies any associated symptoms. No vaginal bleeding. Took IBF without relief. She has a [employer] fast streamerMirena. Her PCP noted she could not see the strings at her apt 1 week ago. She has not yet followed up with her GYN.

## 2016-07-09 NOTE — Telephone Encounter (Signed)
I walked the patient down to x-ray she left her handbag in the room and asked if she could come back up to get it and call her mother who had her children. I said sure no problem. She returned to xray and when Darius finished with the patient before her he went to bring [place] and she was gone. I called her cell phone and it went straight to voice mail. Mariana KaufmanMarvin said he saw her get into a car with a man and drive away. I left her a message asking her to call back and/or return to Imaging or UC.  I haven't heard back from her.

## 2016-07-09 NOTE — ED Provider Notes (Signed)
[facility]    CSN: [id number] Arrival date & time: 07/09/16  1607     History   Chief Complaint Chief Complaint  Patient presents with  . Pelvic Pain    HPI Kimberly Kirk is a 31 y.o. female.   About 3.5 hours ago patient developed sudden sharp left lower quadrant abdominal pain.  The pain does not radiate, and is somewhat worse with movement.  No urinary symptoms.  No GI symptoms.  No vaginal discharge or vaginal bleeding.  No fevers, chills, and sweats   Her last menstrual period was about 2 to 3 weeks ago (she has Mirena IUD)  She notes irregular periods.  On July 3, she had a normal pelvic exam by her PCP, but her IUD string was not visualized.  She was advised to follow-up with GYN for further evaluation.   The history is provided by the patient.    History reviewed. No pertinent past medical history.  There are no active problems to display for this patient.   Past Surgical History:  Procedure Laterality Date  . CESAREAN SECTION     x 2  . TONSILLECTOMY    . WISDOM TOOTH EXTRACTION      OB History    No data available       Home Medications    Prior to Admission medications   Not on File    Family History Family History  Problem Relation Age of Onset  . Diabetes Other     Social History Social History  Substance Use Topics  . Smoking status: Never Smoker  . Smokeless tobacco: Never Used  . Alcohol use No     Allergies   Patient has no known allergies.   Review of Systems Review of Systems  Constitutional: Negative for activity change, appetite change, chills, diaphoresis, fatigue and fever.  HENT: Negative.   Eyes: Negative.   Respiratory: Negative.   Cardiovascular: Negative.   Gastrointestinal: Positive for abdominal pain. Negative for abdominal distention, blood in stool, constipation, diarrhea, nausea and vomiting.  Genitourinary: Negative for dysuria, flank pain, frequency, genital sores, hematuria, menstrual problem,  pelvic pain, urgency, vaginal bleeding, vaginal discharge and vaginal pain.  Musculoskeletal: Negative.   Skin: Negative.   Neurological: Negative.      Physical Exam Triage Vital Signs ED Triage Vitals [07/09/16 1629]  Enc Vitals Group     BP 120/77     Pulse Rate 83     Resp      Temp 98.6 F (37 C)     Temp Source Oral     SpO2 99 %     Weight 204 lb (92.5 kg)     Height      Head Circumference      Peak Flow      Pain Score 5     Pain Loc      Pain Edu?      Excl. in GC?    No data found.   Updated Vital Signs BP 120/77 (BP Location: Left Arm)   Pulse 83   Temp 98.6 F (37 C) (Oral)   Wt 204 lb (92.5 kg)   SpO2 99%   BMI 31.95 kg/m   Visual Acuity Right Eye Distance:   Left Eye Distance:   Bilateral Distance:    Right Eye Near:   Left Eye Near:    Bilateral Near:     Physical Exam  Constitutional: She appears well-developed and well-nourished. No distress.  HENT:  Head:  Normocephalic.  Right Ear: External ear normal.  Left Ear: External ear normal.  Nose: Nose normal.  Mouth/Throat: Oropharynx is clear and moist.  Eyes: Conjunctivae are normal. Pupils are equal, round, and reactive to light.  Neck: Neck supple.  Cardiovascular: Normal heart sounds.   Pulmonary/Chest: Breath sounds normal.  Abdominal: Soft. Bowel sounds are normal. She exhibits no distension and no mass. There is no hepatosplenomegaly. There is tenderness in the left lower quadrant. There is no rigidity, no rebound, no guarding, no CVA tenderness and no tenderness at McBurney's point. No hernia.    Localized tenderness to palpation left lower quadrant as noted on diagram.   Musculoskeletal: She exhibits no edema.  Lymphadenopathy:    She has no cervical adenopathy.  Neurological: She is alert.  Skin: Skin is warm and dry. No rash noted.  Nursing note and vitals reviewed.    UC Treatments / Results  Labs (all labs ordered are listed, but only abnormal results are  displayed) Labs Reviewed  POCT URINALYSIS DIP (MANUAL ENTRY) - Abnormal; Notable for the following:       Result Value   Ketones, POC UA small (15) (*)    All other components within normal limits  POCT URINE PREGNANCY negative    EKG  EKG Interpretation None       Radiology No results found.  Procedures Procedures (including critical care time)  Medications Ordered in UC Medications - No data to display   Initial Impression / Assessment and Plan / UC Course  I have reviewed the triage vital signs and the nursing notes.  Pertinent labs & imaging results that were available during my care of the patient were reviewed by me and considered in my medical decision making (see chart for details).    Negative urine pregnancy test and urinalysis reassuring. Patient sent for KUB, but left facility before study completed.  Suspect ruptured ovarian cyst.    Final Clinical Impressions(s) / UC Diagnoses   Final diagnoses:  Lower abdominal pain    New Prescriptions There are no discharge medications for this patient.    Lattie Haw, MD 07/14/16 [phone]

## 2017-09-29 ENCOUNTER — Inpatient Hospital Stay (HOSPITAL_COMMUNITY)
Admission: AD | Admit: 2017-09-29 | Discharge: 2017-09-29 | Disposition: A | Discharge status: Home / Self Care | Payer: 59 | Source: Ambulatory Visit | Attending: Obstetrics and Gynecology | Admitting: Obstetrics and Gynecology

## 2017-09-29 ENCOUNTER — Encounter (HOSPITAL_COMMUNITY): Payer: Self-pay | Admitting: *Deleted

## 2017-09-29 DIAGNOSIS — O210 Mild hyperemesis gravidarum: Secondary | ICD-10-CM | POA: Diagnosis present

## 2017-09-29 DIAGNOSIS — Z3A01 Less than 8 weeks gestation of pregnancy: Secondary | ICD-10-CM | POA: Diagnosis not present

## 2017-09-29 LAB — URINALYSIS, ROUTINE W REFLEX MICROSCOPIC
BILIRUBIN URINE: NEGATIVE
Glucose, UA: NEGATIVE mg/dL
Hgb urine dipstick: NEGATIVE
Ketones, ur: 20 mg/dL — AB
Leukocytes, UA: NEGATIVE
Nitrite: NEGATIVE
Protein, ur: NEGATIVE mg/dL
Specific Gravity, Urine: 1.018 (ref 1.005–1.030)
pH: 5 (ref 5.0–8.0)

## 2017-09-29 LAB — POCT PREGNANCY, URINE: PREG TEST UR: POSITIVE — AB

## 2017-09-29 MED ORDER — PROMETHAZINE HCL 25 MG PO TABS: 25.0000 mg | ORAL_TABLET | Freq: Four times a day (QID) | ORAL | 0 refills | Status: DC | PRN

## 2017-09-29 MED ORDER — PROMETHAZINE HCL 25 MG/ML IJ SOLN
25.0000 mg | Freq: Once | INTRAVENOUS | Status: AC
  Administered 2017-09-29: 25 mg via INTRAVENOUS
  Filled 2017-09-29: qty 1

## 2017-09-29 NOTE — MAU Note (Signed)
Pt is a G3P2 at 7 weeks c/o N&V for 1 weeks, last food way yesterday.

## 2017-09-29 NOTE — MAU Note (Addendum)
Pt C/O vomiting for the past week, has become worse, can't hold anything down.  Was ordered med for nausea yesterday, had to order it online because of insurance.  Is feeling weak.  Denies pain.  Pt has her first appointment tomorrow with [facility].

## 2017-09-29 NOTE — MAU Provider Note (Signed)
History     CSN: [id number]  Arrival date and time: 09/29/17 4098   First Provider Initiated Contact with Patient 09/29/17 1307      Chief Complaint  Patient presents with  . Emesis   HPI Kimberly Kirk 32 y.o. [redacted]w[redacted]d  Has been having vomiting for several days.  Has been prescribed Bonjesta but has not yet received it from her mail order pharmacy.  Tried jello today and vomited.  Called the office and was told to come to MAU for IV fluids.  Has been here for over 3 hours and has not eaten anything else today.  Is feeling nauseated but has not vomited here today.  Has her appointment for to begin prenatal care in the office tomorrow.  OB History    Gravida  3   Para  2   Term  2   Preterm      AB      Living  2     SAB      TAB      Ectopic      Multiple      Live Births  2           History reviewed. No pertinent past medical history.  Past Surgical History:  Procedure Laterality Date  . CESAREAN SECTION     x 2  . TONSILLECTOMY    . WISDOM TOOTH EXTRACTION      Family History  Problem Relation Age of Onset  . Diabetes Other     Social History   Tobacco Use  . Smoking status: Never Smoker  . Smokeless tobacco: Never Used  Substance Use Topics  . Alcohol use: No  . Drug use: No    Allergies: No Known Allergies  No medications prior to admission.    Review of Systems  Constitutional: Negative for fever.  Gastrointestinal: Positive for nausea and vomiting. Negative for abdominal pain.  Genitourinary: Negative for dysuria, vaginal bleeding and vaginal discharge.   Physical Exam   Blood pressure 116/77, pulse 87, temperature 98.7 F (37.1 C), temperature source Oral, resp. rate 18, height 5\' 7"  (1.702 m), weight 92.1 kg, last menstrual period 08/11/2017.  Physical Exam  Nursing note and vitals reviewed. Constitutional: She is oriented to person, place, and time. She appears well-developed and well-nourished.  HENT:  Head:  Normocephalic.  Eyes: EOM are normal.  Neck: Neck supple.  Respiratory: Effort normal.  GI: Soft. There is no tenderness. There is no rebound and no guarding.  Musculoskeletal: Normal range of motion.  Neurological: She is alert and oriented to person, place, and time.  Skin: Skin is warm and dry.  Psychiatric: She has a normal mood and affect.    MAU Course  Procedures Results for orders placed or performed during the hospital encounter of 09/29/17 (from the past 24 hour(s))  Urinalysis, Routine w reflex microscopic     Status: Abnormal   Collection Time: 09/29/17 10:22 AM  Result Value Ref Range   Color, Urine YELLOW YELLOW   APPearance HAZY (A) CLEAR   Specific Gravity, Urine 1.018 1.005 - 1.030   pH 5.0 5.0 - 8.0   Glucose, UA NEGATIVE NEGATIVE mg/dL   Hgb urine dipstick NEGATIVE NEGATIVE   Bilirubin Urine NEGATIVE NEGATIVE   Ketones, ur 20 (A) NEGATIVE mg/dL   Protein, ur NEGATIVE NEGATIVE mg/dL   Nitrite NEGATIVE NEGATIVE   Leukocytes, UA NEGATIVE NEGATIVE  Pregnancy, urine POC     Status: Abnormal   Collection Time: 09/29/17  10:22 AM  Result Value Ref Range   Preg Test, Ur POSITIVE (A) NEGATIVE    MDM Discussed morning sickness and ways to handle it in pregnancy.  Assessment and Plan  Morning sickness  Plan IV fluids with Phenergan given Able to take PO fluids and crackers after IVF. BRAT diet today and advance slowly. Keep your appointment in the office tomorrow. See AVS for additional info given to client. Reviewed taking vitamin B6 and Unisom at night for nausea.  Lovie Agresta L Lindwood Mogel 09/29/2017, 1:17 PM

## 2017-09-29 NOTE — Discharge Instructions (Signed)

## 2017-10-04 ENCOUNTER — Encounter (HOSPITAL_COMMUNITY): Payer: Self-pay | Admitting: *Deleted

## 2017-10-04 ENCOUNTER — Other Ambulatory Visit: Payer: Self-pay

## 2017-10-04 ENCOUNTER — Inpatient Hospital Stay (HOSPITAL_COMMUNITY)
Admission: AD | Admit: 2017-10-04 | Discharge: 2017-10-04 | Disposition: A | Discharge status: Home / Self Care | Payer: 59 | Source: Ambulatory Visit | Attending: Obstetrics and Gynecology | Admitting: Obstetrics and Gynecology

## 2017-10-04 DIAGNOSIS — O211 Hyperemesis gravidarum with metabolic disturbance: Secondary | ICD-10-CM | POA: Insufficient documentation

## 2017-10-04 DIAGNOSIS — Z3A01 Less than 8 weeks gestation of pregnancy: Secondary | ICD-10-CM | POA: Diagnosis not present

## 2017-10-04 DIAGNOSIS — R1110 Vomiting, unspecified: Secondary | ICD-10-CM

## 2017-10-04 DIAGNOSIS — O219 Vomiting of pregnancy, unspecified: Secondary | ICD-10-CM | POA: Diagnosis not present

## 2017-10-04 DIAGNOSIS — O210 Mild hyperemesis gravidarum: Secondary | ICD-10-CM | POA: Diagnosis present

## 2017-10-04 DIAGNOSIS — E860 Dehydration: Secondary | ICD-10-CM

## 2017-10-04 LAB — CBC
HCT: 44.1 % (ref 36.0–46.0)
Hemoglobin: 14.8 g/dL (ref 12.0–15.0)
MCH: 30.4 pg (ref 26.0–34.0)
MCHC: 33.6 g/dL (ref 30.0–36.0)
MCV: 90.6 fL (ref 78.0–100.0)
PLATELETS: 266 10*3/uL (ref 150–400)
RBC: 4.87 MIL/uL (ref 3.87–5.11)
RDW: 13 % (ref 11.5–15.5)
WBC: 10.4 10*3/uL (ref 4.0–10.5)

## 2017-10-04 LAB — URINALYSIS, ROUTINE W REFLEX MICROSCOPIC
Bilirubin Urine: NEGATIVE
Glucose, UA: NEGATIVE mg/dL
HGB URINE DIPSTICK: NEGATIVE
KETONES UR: 80 mg/dL — AB
LEUKOCYTES UA: NEGATIVE
NITRITE: NEGATIVE
PROTEIN: 30 mg/dL — AB
Specific Gravity, Urine: 1.026 (ref 1.005–1.030)
pH: 5 (ref 5.0–8.0)

## 2017-10-04 LAB — COMPREHENSIVE METABOLIC PANEL
ALT: 29 U/L (ref 0–44)
AST: 24 U/L (ref 15–41)
Albumin: 3.6 g/dL (ref 3.5–5.0)
Alkaline Phosphatase: 66 U/L (ref 38–126)
Anion gap: 9 (ref 5–15)
BUN: 9 mg/dL (ref 6–20)
CALCIUM: 8.5 mg/dL — AB (ref 8.9–10.3)
CO2: 24 mmol/L (ref 22–32)
Chloride: 103 mmol/L (ref 98–111)
Creatinine, Ser: 0.62 mg/dL (ref 0.44–1.00)
GFR calc Af Amer: >60 mL/min (ref >60)
GFR calc non Af Amer: >60 mL/min (ref >60)
Glucose, Bld: 91 mg/dL (ref 70–99)
POTASSIUM: 3.9 mmol/L (ref 3.5–5.1)
SODIUM: 136 mmol/L (ref 135–145)
Total Bilirubin: 0.9 mg/dL (ref 0.3–1.2)
Total Protein: 6.9 g/dL (ref 6.5–8.1)

## 2017-10-04 MED ORDER — ONDANSETRON 4 MG PO TBDP: 4.0000 mg | ORAL_TABLET | Freq: Three times a day (TID) | ORAL | 1 refills | Status: DC | PRN

## 2017-10-04 MED ORDER — SCOPOLAMINE 1 MG/3DAYS TD PT72: 1.0000 | MEDICATED_PATCH | TRANSDERMAL | 1 refills | Status: DC

## 2017-10-04 MED ORDER — M.V.I. ADULT IV INJ
INJECTION | Freq: Once | INTRAVENOUS | Status: AC
  Administered 2017-10-04: 11:00:00 via INTRAVENOUS
  Filled 2017-10-04: qty 10

## 2017-10-04 MED ORDER — SCOPOLAMINE 1 MG/3DAYS TD PT72
1.0000 | MEDICATED_PATCH | TRANSDERMAL | Status: DC
  Administered 2017-10-04: 1.5 mg via TRANSDERMAL
  Filled 2017-10-04: qty 1

## 2017-10-04 MED ORDER — FAMOTIDINE IN NACL 20-0.9 MG/50ML-% IV SOLN
20.0000 mg | Freq: Once | INTRAVENOUS | Status: AC
  Administered 2017-10-04: 20 mg via INTRAVENOUS
  Filled 2017-10-04: qty 50

## 2017-10-04 MED ORDER — SODIUM CHLORIDE 0.9 % IV SOLN
8.0000 mg | Freq: Once | INTRAVENOUS | Status: AC
  Administered 2017-10-04: 8 mg via INTRAVENOUS
  Filled 2017-10-04: qty 4

## 2017-10-04 MED ORDER — LACTATED RINGERS IV BOLUS
1000.0000 mL | Freq: Once | INTRAVENOUS | Status: AC
  Administered 2017-10-04: 1000 mL via INTRAVENOUS

## 2017-10-04 NOTE — MAU Note (Signed)
Pt presents to MAU with complaints of nausea and vomiting  Has not been able to keep anything down for a week. Was in MAU last THursday for IV fluids and is taking Phenergan and it is not helping

## 2017-10-04 NOTE — Discharge Instructions (Signed)
Hyperemesis Gravidarum °Hyperemesis gravidarum is a severe form of nausea and vomiting that happens during pregnancy. Hyperemesis is worse than morning sickness. It may cause you to have nausea or vomiting all day for many days. It may keep you from eating and drinking enough food and liquids. Hyperemesis usually occurs during the first half (the first 20 weeks) of pregnancy. It often goes away once a woman is in her second half of pregnancy. However, sometimes hyperemesis continues through an entire pregnancy. °What are the causes? °The cause of this condition is not known. It may be related to changes in chemicals (hormones) in the body during pregnancy, such as the high level of pregnancy hormone (human chorionic gonadotropin) or the increase in the female sex hormone (estrogen). °What are the signs or symptoms? °Symptoms of this condition include: °· Severe nausea and vomiting. °· Nausea that does not go away. °· Vomiting that does not allow you to keep any food down. °· Weight loss. °· Body fluid loss (dehydration). °· Having no desire to eat, or not liking food that you have previously enjoyed. ° °How is this diagnosed? °This condition may be diagnosed based on: °· A physical exam. °· Your medical history. °· Your symptoms. °· Blood tests. °· Urine tests. ° °How is this treated? °This condition may be managed with medicine. If medicines to do not help relieve nausea and vomiting, you may need to receive fluids through an IV tube at the hospital. °Follow these instructions at home: °· Take over-the-counter and prescription medicines only as told by your health care provider. °· Avoid iron pills and multivitamins that contain iron for the first 3-4 months of pregnancy. If you take prescription iron pills, do not stop taking them unless your health care provider approves. °· Take the following actions to help prevent nausea and vomiting: °? In the morning, before getting out of bed, try eating a couple of dry  crackers or a piece of toast. °? Avoid foods and smells that upset your stomach. Fatty and spicy foods may make nausea worse. °? Eat 5-6 small meals a day. °? Do not drink fluids while eating meals. Drink between meals. °? Eat or suck on things that have ginger in them. Ginger can help relieve nausea. °? Avoid food preparation. The smell of food can spoil your appetite or trigger nausea. °· Follow instructions from your health care provider about eating or drinking restrictions. °· For snacks, eat high-protein foods, such as cheese. °· Keep all follow-up and pre-birth (prenatal) visits as told by your health care provider. This is important. °Contact a health care provider if: °· You have pain in your abdomen. °· You have a severe headache. °· You have vision problems. °· You are losing weight. °Get help right away if: °· You cannot drink fluids without vomiting. °· You vomit blood. °· You have constant nausea and vomiting. °· You are very weak. °· You are very thirsty. °· You feel dizzy. °· You faint. °· You have a fever or other symptoms that last for more than 2-3 days. °· You have a fever and your symptoms suddenly get worse. °Summary °· Hyperemesis gravidarum is a severe form of nausea and vomiting that happens during pregnancy. °· Making some changes to your eating habits may help relieve nausea and vomiting. °· This condition may be managed with medicine. °· If medicines to do not help relieve nausea and vomiting, you may need to receive fluids through an IV tube at the hospital. °This   information is not intended to replace advice given to you by your health care provider. Make sure you discuss any questions you have with your health care provider. °Document Released: 12/21/2004 Document Revised: 08/20/2015 Document Reviewed: 08/20/2015 °Elsevier Interactive Patient Education © 2017 [employer] °Eating Plan for Hyperemesis Gravidarum °Hyperemesis gravidarum is a severe form of morning sickness. Because this  condition causes severe nausea and vomiting, it can lead to dehydration, malnutrition, and weight loss. One way to lessen the symptoms of nausea and vomiting is to follow the eating plan for hyperemesis gravidarum. It is often used along with prescribed medicines to control your symptoms. °What can I do to relieve my symptoms? °Listen to your body. Everyone is different and has different preferences. Find what works best for you. Take any of the following actions that are helpful to you: °· Eat and drink slowly. °· Eat 5-6 small meals daily instead of 3 large meals. °· Eat crackers before you get out of bed in the morning. °· Try having a snack in the middle of the night. °· Starchy foods are usually tolerated well. Examples include cereal, toast, bread, potatoes, pasta, rice, and pretzels. °· Ginger may help with nausea. Add ¼ tsp ground ginger to hot tea or choose ginger tea. °· Try drinking 100% fruit juice or an electrolyte drink. An electrolyte drink contains sodium, potassium, and chloride. °· Continue to take your prenatal vitamins as told by your health care provider. If you are having trouble taking your prenatal vitamins, talk with your health care provider about different options. °· Include at least 1 serving of protein with your meals and snacks. Protein options include meats or poultry, beans, nuts, eggs, and yogurt. Try eating a protein-rich snack before bed. Examples of these snacks include cheese and crackers or half of a peanut butter or turkey sandwich. °· Consider eliminating foods that trigger your symptoms. These may include spicy foods, coffee, high-fat foods, very sweet foods, and acidic foods. °· Try meals that have more protein combined with bland, salty, lower-fat, and dry foods, such as nuts, seeds, pretzels, crackers, and cereal. °· Talk with your healthcare provider about starting a supplement of vitamin B6. °· Have fluids that are cold, clear, and carbonated or sour. Examples include  lemonade, ginger ale, lemon-lime soda, ice water, and sparkling water. °· Try lemon or mint tea. °· Try brushing your teeth or using a mouth rinse after meals. ° °What should I avoid to reduce my symptoms? °Avoiding some of the following things may help reduce your symptoms. °· Foods with strong smells. Try eating meals in well-ventilated areas that are free of odors. °· Drinking water or other beverages with meals. Try not to drink anything during the 30 minutes before and after your meals. °· Drinking more than 1 cup of fluid at a time. Sometimes using a straw helps. °· Fried or high-fat foods, such as butter and cream sauces. °· Spicy foods. °· Skipping meals as best as you can. Nausea can be more intense on an empty stomach. If you cannot tolerate food at that time, do not force it. Try sucking on ice chips or other frozen items, and make up for missed calories later. °· Lying down within 2 hours after eating. °· Environmental triggers. These may include smoky rooms, closed spaces, rooms with strong smells, warm or humid places, overly loud and noisy rooms, and rooms with motion or flickering lights. °· Quick and sudden changes in your movement. ° °This information is not   intended to replace advice given to you by your health care provider. Make sure you discuss any questions you have with your health care provider. °Document Released: 10/18/2006 Document Revised: 08/20/2015 Document Reviewed: 07/22/2015 °Elsevier Interactive Patient Education © 2018 [employer] ° °

## 2017-10-04 NOTE — MAU Provider Note (Addendum)
History     CSN: [id number]  Arrival date and time: 10/04/17 4696   First Provider Initiated Contact with Patient 10/04/17 [id number]      Chief Complaint  Patient presents with  . Emesis  . Dizziness   G3P2002 @7 .5 eks here with N/V. Sx have been ongoing for about 1 week. She denies diarrhea or fevers. She was seen in MAU 5 days ago and given Phenergan and IV fluids which helped for a few hours. She is able to tolerate small amts of fluid at times. Denies VB or pain. She's had 6 lb weight loss in 5 days.   OB History    Gravida  3   Para  2   Term  2   Preterm      AB      Living  2     SAB      TAB      Ectopic      Multiple      Live Births  2           History reviewed. No pertinent past medical history.  Past Surgical History:  Procedure Laterality Date  . CESAREAN SECTION     x 2  . TONSILLECTOMY    . WISDOM TOOTH EXTRACTION      Family History  Problem Relation Age of Onset  . Diabetes Other     Social History   Tobacco Use  . Smoking status: Never Smoker  . Smokeless tobacco: Never Used  Substance Use Topics  . Alcohol use: No  . Drug use: No    Allergies: No Known Allergies  Medications Prior to Admission  Medication Sig Dispense Refill Last Dose  . Prenatal Vit-Fe Fumarate-FA (PRENATAL MULTIVITAMIN) TABS tablet Take 1 tablet by mouth daily at 12 noon.   Past Week at Unknown time  . promethazine (PHENERGAN) 25 MG tablet Take 1 tablet (25 mg total) by mouth every 6 (six) hours as needed for nausea or vomiting. 30 tablet 0     Review of Systems  Constitutional: Negative for fever.  Gastrointestinal: Positive for nausea and vomiting. Negative for abdominal pain and diarrhea.  Genitourinary: Negative for vaginal bleeding.   Physical Exam   Blood pressure 105/63, pulse 82, temperature 99.1 F (37.3 C), resp. rate 16, weight 89.2 kg, last menstrual period 08/11/2017.  Physical Exam  Nursing note and vitals  reviewed. Constitutional: She is oriented to person, place, and time. She appears well-developed and well-nourished. No distress.  HENT:  Head: Normocephalic and atraumatic.  Neck: Normal range of motion.  Cardiovascular: Normal rate.  Respiratory: Effort normal. No respiratory distress.  Musculoskeletal: Normal range of motion.  Neurological: She is alert and oriented to person, place, and time.  Psychiatric: She has a normal mood and affect.   Results for orders placed or performed during the hospital encounter of 10/04/17 (from the past 24 hour(s))  Urinalysis, Routine w reflex microscopic     Status: Abnormal   Collection Time: 10/04/17  9:05 AM  Result Value Ref Range   Color, Urine AMBER (A) YELLOW   APPearance HAZY (A) CLEAR   Specific Gravity, Urine 1.026 1.005 - 1.030   pH 5.0 5.0 - 8.0   Glucose, UA NEGATIVE NEGATIVE mg/dL   Hgb urine dipstick NEGATIVE NEGATIVE   Bilirubin Urine NEGATIVE NEGATIVE   Ketones, ur 80 (A) NEGATIVE mg/dL   Protein, ur 30 (A) NEGATIVE mg/dL   Nitrite NEGATIVE NEGATIVE   Leukocytes, UA NEGATIVE NEGATIVE  RBC / HPF 0-5 0 - 5 RBC/hpf   WBC, UA 0-5 0 - 5 WBC/hpf   Bacteria, UA RARE (A) NONE SEEN   Squamous Epithelial / LPF 11-20 0 - 5   Mucus PRESENT   CBC     Status: None   Collection Time: 10/04/17  9:32 AM  Result Value Ref Range   WBC 10.4 4.0 - 10.5 K/uL   RBC 4.87 3.87 - 5.11 MIL/uL   Hemoglobin 14.8 12.0 - 15.0 g/dL   HCT 16.1 09.6 - 04.5 %   MCV 90.6 78.0 - 100.0 fL   MCH 30.4 26.0 - 34.0 pg   MCHC 33.6 30.0 - 36.0 g/dL   RDW 40.9 81.1 - 91.4 %   Platelets 266 150 - 400 K/uL  Comprehensive metabolic panel     Status: Abnormal   Collection Time: 10/04/17 10:49 AM  Result Value Ref Range   Sodium 136 135 - 145 mmol/L   Potassium 3.9 3.5 - 5.1 mmol/L   Chloride 103 98 - 111 mmol/L   CO2 24 22 - 32 mmol/L   Glucose, Bld 91 70 - 99 mg/dL   BUN 9 6 - 20 mg/dL   Creatinine, Ser 7.82 0.44 - 1.00 mg/dL   Calcium 8.5 (L) 8.9 - 10.3  mg/dL   Total Protein 6.9 6.5 - 8.1 g/dL   Albumin 3.6 3.5 - 5.0 g/dL   AST 24 15 - 41 U/L   ALT 29 0 - 44 U/L   Alkaline Phosphatase 66 38 - 126 U/L   Total Bilirubin 0.9 0.3 - 1.2 mg/dL   GFR calc non Af Amer >60 >60 mL/min   GFR calc Af Amer >60 >60 mL/min   Anion gap 9 5 - 15   MAU Course  Procedures LR MTV Pepcid Zofran Scop patch  MDM Labs ordered and reviewed. Feeling better after meds. No emesis. Tolerating po fluid and crackers. Will try Zofran. Discussed the risk of using Zofran in early pregnancy: Available data suggests that use of Zofran (Ondansetron) in early pregnancy is not associated with a high risk of congenital malformations but a small absolute increase in risk of CV malformations (especially septum defects) and cleft palate may exist. The patient was warned of constipation. Stable for discharge home.   Assessment and Plan   1. Hyperemesis   2. [redacted] weeks gestation of pregnancy   3. Dehydration    Discharge home Follow up at [facility] in 3 days as scheduled Rx Zofran Rx Scop patch Continue Bonjesta Start Pepcid at hs  Allergies as of 10/04/2017   No Known Allergies     Medication List    TAKE these medications   ondansetron 4 MG disintegrating tablet Commonly known as:  ZOFRAN-ODT Take 1 tablet (4 mg total) by mouth every 8 (eight) hours as needed for nausea or vomiting.   prenatal multivitamin Tabs tablet Take 1 tablet by mouth daily at 12 noon.   promethazine 25 MG tablet Commonly known as:  PHENERGAN Take 1 tablet (25 mg total) by mouth every 6 (six) hours as needed for nausea or vomiting.   scopolamine 1 MG/3DAYS Commonly known as:  TRANSDERM-SCOP Place 1 patch (1.5 mg total) onto the skin every 3 (three) days. Start taking on:  10/07/2017      Nelida Mandarino, CNM 10/04/2017, 11:45 AM

## 2017-10-04 NOTE — MAU Note (Signed)
Ongoing nausea and vomiting.  Dizzy at times.  No pain or bleeding.

## 2017-10-07 LAB — OB RESULTS CONSOLE HIV ANTIBODY (ROUTINE TESTING): HIV: NONREACTIVE

## 2017-10-07 LAB — OB RESULTS CONSOLE HEPATITIS B SURFACE ANTIGEN: Hepatitis B Surface Ag: NEGATIVE

## 2017-10-07 LAB — OB RESULTS CONSOLE ABO/RH: RH Type: NEGATIVE

## 2017-10-07 LAB — OB RESULTS CONSOLE RPR: RPR: NONREACTIVE

## 2017-10-07 LAB — OB RESULTS CONSOLE RUBELLA ANTIBODY, IGM: Rubella: IMMUNE

## 2018-04-03 ENCOUNTER — Encounter (HOSPITAL_COMMUNITY): Payer: Self-pay | Admitting: *Deleted

## 2018-04-03 ENCOUNTER — Inpatient Hospital Stay (HOSPITAL_COMMUNITY)
Admission: EM | Admit: 2018-04-03 | Discharge: 2018-04-07 | DRG: 818 | Disposition: A | Discharge status: Home / Self Care | Payer: 59 | Attending: Obstetrics & Gynecology | Admitting: Obstetrics & Gynecology

## 2018-04-03 ENCOUNTER — Other Ambulatory Visit: Payer: Self-pay

## 2018-04-03 DIAGNOSIS — O99613 Diseases of the digestive system complicating pregnancy, third trimester: Principal | ICD-10-CM | POA: Diagnosis present

## 2018-04-03 DIAGNOSIS — K8000 Calculus of gallbladder with acute cholecystitis without obstruction: Secondary | ICD-10-CM | POA: Diagnosis present

## 2018-04-03 DIAGNOSIS — O210 Mild hyperemesis gravidarum: Secondary | ICD-10-CM | POA: Diagnosis present

## 2018-04-03 DIAGNOSIS — Z3A33 33 weeks gestation of pregnancy: Secondary | ICD-10-CM

## 2018-04-03 DIAGNOSIS — R1011 Right upper quadrant pain: Secondary | ICD-10-CM

## 2018-04-03 DIAGNOSIS — K819 Cholecystitis, unspecified: Secondary | ICD-10-CM | POA: Diagnosis present

## 2018-04-03 HISTORY — DX: Other specified health status: Z78.9

## 2018-04-03 NOTE — MAU Note (Signed)
Pt reports upper abdominal pain that started at 9pm. Stabbing, shooting, constant pain. Unsure if contractions. Denies LOF or vaginal bleeding. Reports good fetal movement. No recent intercourse or cervical exam.

## 2018-04-03 NOTE — MAU Note (Signed)
Vomiting x3 and loose stools since pain started.

## 2018-04-04 ENCOUNTER — Encounter (HOSPITAL_COMMUNITY): Payer: Self-pay | Admitting: *Deleted

## 2018-04-04 ENCOUNTER — Inpatient Hospital Stay (HOSPITAL_COMMUNITY): Payer: 59

## 2018-04-04 DIAGNOSIS — Z3A33 33 weeks gestation of pregnancy: Secondary | ICD-10-CM | POA: Diagnosis not present

## 2018-04-04 DIAGNOSIS — K819 Cholecystitis, unspecified: Secondary | ICD-10-CM | POA: Diagnosis present

## 2018-04-04 DIAGNOSIS — R1011 Right upper quadrant pain: Secondary | ICD-10-CM | POA: Diagnosis present

## 2018-04-04 DIAGNOSIS — O26893 Other specified pregnancy related conditions, third trimester: Secondary | ICD-10-CM

## 2018-04-04 DIAGNOSIS — K8000 Calculus of gallbladder with acute cholecystitis without obstruction: Secondary | ICD-10-CM | POA: Diagnosis present

## 2018-04-04 DIAGNOSIS — O210 Mild hyperemesis gravidarum: Secondary | ICD-10-CM | POA: Diagnosis present

## 2018-04-04 DIAGNOSIS — O99613 Diseases of the digestive system complicating pregnancy, third trimester: Secondary | ICD-10-CM | POA: Diagnosis present

## 2018-04-04 LAB — COMPREHENSIVE METABOLIC PANEL
ALBUMIN: 2.4 g/dL — AB (ref 3.5–5.0)
ALT: 20 U/L (ref 0–44)
ALT: 32 U/L (ref 0–44)
AST: 73 U/L — AB (ref 15–41)
AST: 54 U/L — ABNORMAL HIGH (ref 15–41)
Albumin: 2.2 g/dL — ABNORMAL LOW (ref 3.5–5.0)
Alkaline Phosphatase: 126 U/L (ref 38–126)
Alkaline Phosphatase: 133 U/L — ABNORMAL HIGH (ref 38–126)
Anion gap: 8 (ref 5–15)
Anion gap: 8 (ref 5–15)
BUN: 8 mg/dL (ref 6–20)
BUN: 6 mg/dL (ref 6–20)
CHLORIDE: 105 mmol/L (ref 98–111)
CHLORIDE: 104 mmol/L (ref 98–111)
CO2: 22 mmol/L (ref 22–32)
CO2: 23 mmol/L (ref 22–32)
Calcium: 8.5 mg/dL — ABNORMAL LOW (ref 8.9–10.3)
Calcium: 8.5 mg/dL — ABNORMAL LOW (ref 8.9–10.3)
Creatinine, Ser: 0.62 mg/dL (ref 0.44–1.00)
Creatinine, Ser: 0.61 mg/dL (ref 0.44–1.00)
GFR calc Af Amer: >60 mL/min (ref >60)
GFR calc Af Amer: >60 mL/min (ref >60)
GFR calc non Af Amer: >60 mL/min (ref >60)
GFR calc non Af Amer: >60 mL/min (ref >60)
Glucose, Bld: 142 mg/dL — ABNORMAL HIGH (ref 70–99)
Glucose, Bld: 94 mg/dL (ref 70–99)
POTASSIUM: 4 mmol/L (ref 3.5–5.1)
Potassium: 4.9 mmol/L (ref 3.5–5.1)
Sodium: 135 mmol/L (ref 135–145)
Sodium: 135 mmol/L (ref 135–145)
Total Bilirubin: 1.4 mg/dL — ABNORMAL HIGH (ref 0.3–1.2)
Total Bilirubin: 1.1 mg/dL (ref 0.3–1.2)
Total Protein: 5.3 g/dL — ABNORMAL LOW (ref 6.5–8.1)
Total Protein: 6 g/dL — ABNORMAL LOW (ref 6.5–8.1)

## 2018-04-04 LAB — CBC WITH DIFFERENTIAL/PLATELET
Abs Immature Granulocytes: 0.09 10*3/uL — ABNORMAL HIGH (ref 0.00–0.07)
Basophils Absolute: 0 10*3/uL (ref 0.0–0.1)
Basophils Relative: 0 %
Eosinophils Absolute: 0 10*3/uL (ref 0.0–0.5)
Eosinophils Relative: 0 %
HCT: 35 % — ABNORMAL LOW (ref 36.0–46.0)
HEMOGLOBIN: 10.9 g/dL — AB (ref 12.0–15.0)
Immature Granulocytes: 1 %
LYMPHS ABS: 1.1 10*3/uL (ref 0.7–4.0)
LYMPHS PCT: 7 %
MCH: 27.9 pg (ref 26.0–34.0)
MCHC: 31.1 g/dL (ref 30.0–36.0)
MCV: 89.5 fL (ref 80.0–100.0)
Monocytes Absolute: 0.8 10*3/uL (ref 0.1–1.0)
Monocytes Relative: 5 %
Neutro Abs: 14.2 10*3/uL — ABNORMAL HIGH (ref 1.7–7.7)
Neutrophils Relative %: 87 %
Platelets: 252 10*3/uL (ref 150–400)
RBC: 3.91 MIL/uL (ref 3.87–5.11)
RDW: 13.8 % (ref 11.5–15.5)
WBC: 16.2 10*3/uL — ABNORMAL HIGH (ref 4.0–10.5)
nRBC: 0 % (ref 0.0–0.2)

## 2018-04-04 LAB — URINALYSIS, ROUTINE W REFLEX MICROSCOPIC
Bilirubin Urine: NEGATIVE
Glucose, UA: NEGATIVE mg/dL
Hgb urine dipstick: NEGATIVE
Ketones, ur: 20 mg/dL — AB
Leukocytes,Ua: NEGATIVE
Nitrite: NEGATIVE
PROTEIN: 30 mg/dL — AB
Specific Gravity, Urine: 1.029 (ref 1.005–1.030)
pH: 5 (ref 5.0–8.0)

## 2018-04-04 LAB — WET PREP, GENITAL
Clue Cells Wet Prep HPF POC: NONE SEEN
SPERM: NONE SEEN
Trich, Wet Prep: NONE SEEN
Yeast Wet Prep HPF POC: NONE SEEN

## 2018-04-04 MED ORDER — OXYCODONE-ACETAMINOPHEN 5-325 MG PO TABS
2.0000 | ORAL_TABLET | Freq: Once | ORAL | Status: AC
  Administered 2018-04-04: 2 via ORAL
  Filled 2018-04-04: qty 2

## 2018-04-04 MED ORDER — ZOLPIDEM TARTRATE 5 MG PO TABS
5.0000 mg | ORAL_TABLET | Freq: Every evening | ORAL | Status: DC | PRN
  Administered 2018-04-04: 5 mg via ORAL
  Filled 2018-04-04: qty 1

## 2018-04-04 MED ORDER — DOCUSATE SODIUM 100 MG PO CAPS
100.0000 mg | ORAL_CAPSULE | Freq: Every day | ORAL | Status: DC
  Administered 2018-04-06 – 2018-04-07 (×2): 100 mg via ORAL
  Filled 2018-04-04 (×3): qty 1

## 2018-04-04 MED ORDER — LACTATED RINGERS IV SOLN
INTRAVENOUS | Status: DC
  Administered 2018-04-04 – 2018-04-07 (×8): via INTRAVENOUS

## 2018-04-04 MED ORDER — ACETAMINOPHEN 325 MG PO TABS: 650.0000 mg | ORAL_TABLET | ORAL | Status: DC | PRN

## 2018-04-04 MED ORDER — PRENATAL MULTIVITAMIN CH
1.0000 | ORAL_TABLET | Freq: Every day | ORAL | Status: DC
  Filled 2018-04-04: qty 1

## 2018-04-04 MED ORDER — CALCIUM CARBONATE ANTACID 500 MG PO CHEW: 2.0000 | CHEWABLE_TABLET | ORAL | Status: DC | PRN

## 2018-04-04 MED ORDER — HYDROMORPHONE HCL 1 MG/ML IJ SOLN
1.0000 mg | INTRAMUSCULAR | Status: DC | PRN
  Administered 2018-04-04 – 2018-04-07 (×15): 1 mg via INTRAVENOUS
  Filled 2018-04-04 (×15): qty 1

## 2018-04-04 MED ORDER — PIPERACILLIN-TAZOBACTAM 3.375 G IVPB
3.3750 g | Freq: Three times a day (TID) | INTRAVENOUS | Status: DC
  Administered 2018-04-04 – 2018-04-07 (×10): 3.375 g via INTRAVENOUS
  Filled 2018-04-04 (×11): qty 50

## 2018-04-04 NOTE — MAU Provider Note (Signed)
History     CSN: [id number]  Arrival date and time: 04/03/18 2307   First Provider Initiated Contact with Patient 04/04/18 0008      Chief Complaint  Patient presents with  . Contractions   Kimberly Kirk is a 33 y.o. G3P2002 at [redacted]w[redacted]d who presents for right upper quadrant pain.  She states the pain started about 9pm and has been constant.  She reports taking tylenol, but threw it up about 5 minutes later.  She states the pain is "like someone is cutting me open or stabbed me."  Patient reports that the pain is constant and hasn't stopped since it started rating it a 10/10.  She reports that the pain causes SOB and vomiting that started about 30 minutes after pain onset.  She states she was laying in bed prior to onset and started like a cramp.  She reports eating supper at 1630 and a snack at 1930.  She endorses fetal movement and denies vaginal discharge, lof, or bleeding.     1630 Salad-Cobb Salad   1930 Snack: Cheese/Pickles     OB History    Gravida  3   Para  2   Term  2   Preterm      AB      Living  2     SAB      TAB      Ectopic      Multiple      Live Births  2           Past Medical History:  Diagnosis Date  . Medical history non-contributory     Past Surgical History:  Procedure Laterality Date  . CESAREAN SECTION     x 2  . TONSILLECTOMY    . WISDOM TOOTH EXTRACTION      Family History  Problem Relation Age of Onset  . Diabetes Other     Social History   Tobacco Use  . Smoking status: Never Smoker  . Smokeless tobacco: Never Used  Substance Use Topics  . Alcohol use: No  . Drug use: No    Allergies: No Known Allergies  Medications Prior to Admission  Medication Sig Dispense Refill Last Dose  . acetaminophen (TYLENOL) 500 MG tablet Take 500 mg by mouth every 6 (six) hours as needed.   04/03/2018 at Unknown time  . ondansetron (ZOFRAN ODT) 4 MG disintegrating tablet Take 1 tablet (4 mg total) by mouth every 8 (eight)  hours as needed for nausea or vomiting. 30 tablet 1 04/03/2018 at Unknown time  . Prenatal Vit-Fe Fumarate-FA (PRENATAL MULTIVITAMIN) TABS tablet Take 1 tablet by mouth daily at 12 noon.   04/03/2018 at Unknown time  . pyridoxine (B-6) 100 MG tablet Take 100 mg by mouth daily.   04/03/2018 at Unknown time  . promethazine (PHENERGAN) 25 MG tablet Take 1 tablet (25 mg total) by mouth every 6 (six) hours as needed for nausea or vomiting. 30 tablet 0 More than a month at Unknown time  . scopolamine (TRANSDERM-SCOP) 1 MG/3DAYS Place 1 patch (1.5 mg total) onto the skin every 3 (three) days. 10 patch 1 More than a month at Unknown time    Review of Systems  Constitutional: Negative for chills and fever.  Respiratory: Positive for shortness of breath. Negative for cough.   Gastrointestinal: Positive for abdominal pain, diarrhea, nausea and vomiting. Negative for constipation.  Genitourinary: Negative for dysuria.  Musculoskeletal: Positive for back pain.   Physical Exam   Blood  pressure 124/65, pulse 80, temperature (!) 97.5 F (36.4 C), temperature source Oral, resp. rate 20, last menstrual period 08/11/2017, SpO2 99 %.  Physical Exam  Constitutional: She is oriented to person, place, and time. She appears well-developed and well-nourished. She appears distressed.  HENT:  Head: Normocephalic and atraumatic.  Eyes: Conjunctivae are normal.  Neck: Normal range of motion.  Cardiovascular: Normal rate, regular rhythm and normal heart sounds.  Respiratory: Effort normal and breath sounds normal.  GI: Soft. There is abdominal tenderness. There is rebound and guarding.  Genitourinary: Cervix exhibits no motion tenderness.    Vaginal discharge present.     No vaginal bleeding.  No bleeding in the vagina.  Musculoskeletal: Normal range of motion.  Neurological: She is alert and oriented to person, place, and time.  Skin: Skin is warm and dry.  Psychiatric: She has a normal mood and affect. Her  behavior is normal.    Fetal Assessment 145 bpm, Mod Var, -Decels, +Accels Toco:Occasional   MAU Course   Results for orders placed or performed during the hospital encounter of 04/03/18 (from the past 24 hour(s))  Wet prep, genital     Status: Abnormal   Collection Time: 04/04/18 12:28 AM  Result Value Ref Range   Yeast Wet Prep HPF POC NONE SEEN NONE SEEN   Trich, Wet Prep NONE SEEN NONE SEEN   Clue Cells Wet Prep HPF POC NONE SEEN NONE SEEN   WBC, Wet Prep HPF POC FEW (A) NONE SEEN   Sperm NONE SEEN   CBC with Differential/Platelet     Status: Abnormal   Collection Time: 04/04/18  1:25 AM  Result Value Ref Range   WBC 16.2 (H) 4.0 - 10.5 K/uL   RBC 3.91 3.87 - 5.11 MIL/uL   Hemoglobin 10.9 (L) 12.0 - 15.0 g/dL   HCT 56.2 (L) 13.0 - 86.5 %   MCV 89.5 80.0 - 100.0 fL   MCH 27.9 26.0 - 34.0 pg   MCHC 31.1 30.0 - 36.0 g/dL   RDW 78.4 69.6 - 29.5 %   Platelets 252 150 - 400 K/uL   nRBC 0.0 0.0 - 0.2 %   Neutrophils Relative % 87 %   Neutro Abs 14.2 (H) 1.7 - 7.7 K/uL   Lymphocytes Relative 7 %   Lymphs Abs 1.1 0.7 - 4.0 K/uL   Monocytes Relative 5 %   Monocytes Absolute 0.8 0.1 - 1.0 K/uL   Eosinophils Relative 0 %   Eosinophils Absolute 0.0 0.0 - 0.5 K/uL   Basophils Relative 0 %   Basophils Absolute 0.0 0.0 - 0.1 K/uL   Immature Granulocytes 1 %   Abs Immature Granulocytes 0.09 (H) 0.00 - 0.07 K/uL   No results found.   FINDINGS: Gallbladder:  Multiple echogenic gallstones measuring to 5 mm with acoustic shadowing. No gallbladder wall thickening or pericholecystic fluid. However, sonographic Eulah Pont sign was elicited.  Common bile duct:  Diameter: 4 mm.  Liver:  No focal lesion identified. Within normal limits in parenchymal echogenicity. Portal vein is patent on color Doppler imaging with normal direction of blood flow towards the liver.  IMPRESSION: 1. Cholelithiasis and sonographic Murphy sign suspicious for acute cholecystitis; no  corroborated findings.  MDM PE Labs: CBC with Diff, CMP Abdominal US Pain Management Assessment and Plan  33 year old G3P2002 at 33.5 weeks Cat I FT Right Upper Quadrant Pain  -Exam findings discussed. -Informed that symptoms suggestive of gallbladder stones. -Patient endorses that gallbladder remains in place. -Will give  percocet 2 tablets now for pain. -Wet prep sent. -Will send for abdominal US. -NST reactive   Follow Up (1:07 AM) Acute Cholecystitis Uncontrolled Pain  -Patient returns from [place] and preliminary findings suggestive of cholecystitis. -Patient reports increased pain despite percocet dosing prior to [place]. -Dr. Omar Person contacted with recommendation for admission for observation: *Agrees with plan and requests: *CFM *NPO *Dilaudid 1mg /mL Qhr PRN for pain *Consult to General Surgery  -Orders readback and verified. -General surgery on call Almond Lint, MD consulted and given patient information and final [place] report impressions. Agrees to see patient; general surgery consult-stat status placed. -Dr. Elon Spanner contacted and updated on POC; No other requests. -Standard Antenatal Admission placed with variations as above. -Care concluded by this provider  Cherre Robins MSN, CNM 04/04/2018, 12:08 AM

## 2018-04-04 NOTE — H&P (Signed)
Kimberly Kirk is a 33 y.o. female presenting for RUQ pain - acute onset. Please see MAU for detailed HPI. No fever. Pain worse after eating. 10/10.   OB History    Gravida  3   Para  2   Term  2   Preterm      AB      Living  2     SAB      TAB      Ectopic      Multiple      Live Births  2          Past Medical History:  Diagnosis Date  . Medical history non-contributory    Past Surgical History:  Procedure Laterality Date  . CESAREAN SECTION     x 2  . TONSILLECTOMY    . WISDOM TOOTH EXTRACTION     Family History: family history includes Diabetes in an other family member. Social History:  reports that she has never smoked. She has never used smokeless tobacco. She reports that she does not drink alcohol or use drugs.     Maternal Diabetes: No Genetic Screening: Normal Maternal Ultrasounds/Referrals: Normal Fetal Ultrasounds or other Referrals:  None Maternal Substance Abuse:  No Significant Maternal Medications:  None Significant Maternal Lab Results:  None Other Comments:  None  ROS History Dilation: Closed Exam by:: Gerrit Heck CNM  Blood pressure 111/65, pulse 80, temperature 99.1 F (37.3 C), temperature source Oral, resp. rate 18, height 5\' 7"  (1.702 m), weight 106.1 kg, last menstrual period 08/11/2017, SpO2 100 %. Exam Physical Exam  Prenatal labs: ABO, Rh: --/--/B NEG (03/31 0126) Antibody: POS (03/31 0126) Rubella:   RPR:    HBsAg:    HIV:    GBS:     Assessment/Plan: 33 yo G3P2002 @ 33.5 wga presenting with likely acute cholecystitis. No evidence of PIH or liver abnormalities. + murphy's sign. + leukocytosis. [place] w/stones. Pregnancy uncomplicated except for hyperemesis. She is planned a rCS. No evidence of labor nor any OB complaints currently.   - General surgery consultation for management - Plan IV abx per gen surg - CEFM - NPO for now - IV dil prn for pain - if needs surgical management, consider BMZ for FLM given < 34  wga   Ranae Pila 04/04/2018, 6:30 AM

## 2018-04-04 NOTE — Progress Notes (Signed)
Patient ID: Kimberly Kirk, female   DOB: 20-Sep-1985, 33 y.o.   MRN: [id number]   She still has some intermittent RUQ pain but less than yesterday. Still with mild RUQ tenderness on exam Tibili is down.  Will start clear liquids Hold on MRCP Repeat labs in the morning. Again, trying to avoid cholecystectomy if possible. Continue IV antibiotics  She agrees with the plan

## 2018-04-04 NOTE — Progress Notes (Signed)
Pharmacy Antibiotic Note  Marga Wilker is a 33 y.o. female admitted on 04/03/2018 with concern for cholecystitis.  Pharmacy has been consulted for Zosyn dosing.  Plan: Zosyn 3.375g IV q8h (4 hour infusion).  Height: 5\' 7"  (170.2 cm) Weight: 234 lb (106.1 kg) IBW/kg (Calculated) : 61.6  Temp (24hrs), Avg:98.2 F (36.8 C), Min:97.5 F (36.4 C), Max:99.1 F (37.3 C)  Recent Labs  Lab 04/04/18 0125  WBC 16.2*  CREATININE 0.62    Estimated Creatinine Clearance: 124.2 mL/min (by C-G formula based on SCr of 0.62 mg/dL).    No Known Allergies  Antimicrobials this admission: None   Thank you for allowing pharmacy to be a part of this patient's care.  Sherrilyn Rist 04/04/2018 5:43 AM

## 2018-04-04 NOTE — Consult Note (Signed)
Reason for Consult: Gallstones Referring Physician: Kieu Sankey is an 33 y.o. female.  HPI:  Pt is a 33 yo F G3P2002 at 84 5/7 who presented with sudden onset of RUQ pain.  It came on suddenly at 9 pm.  It was associated with n/v. She described the pain as crampy and severe.   She denies prior symptoms like this.  She had cheese and pickles at 7:30 pm.  She has h/o hyperemesis gravidarum.  She denies family history of gallstones. She required oral pain meds in the MAU and then IV pain meds twice.  She now is feeling much better.  N/v has resolved.  The pain is nearly gone.     Past Medical History:  Diagnosis Date  . Medical history non-contributory     Past Surgical History:  Procedure Laterality Date  . CESAREAN SECTION     x 2  . TONSILLECTOMY    . WISDOM TOOTH EXTRACTION      Family History  Problem Relation Age of Onset  . Diabetes Other     Social History:  reports that she has never smoked. She has never used smokeless tobacco. She reports that she does not drink alcohol or use drugs.  Allergies: No Known Allergies  Medications:  Prior to Admission:  Medications Prior to Admission  Medication Sig Dispense Refill Last Dose  . acetaminophen (TYLENOL) 500 MG tablet Take 500 mg by mouth every 6 (six) hours as needed.   04/03/2018 at Unknown time  . ondansetron (ZOFRAN ODT) 4 MG disintegrating tablet Take 1 tablet (4 mg total) by mouth every 8 (eight) hours as needed for nausea or vomiting. 30 tablet 1 04/03/2018 at Unknown time  . Prenatal Vit-Fe Fumarate-FA (PRENATAL MULTIVITAMIN) TABS tablet Take 1 tablet by mouth daily at 12 noon.   04/03/2018 at Unknown time  . pyridoxine (B-6) 100 MG tablet Take 100 mg by mouth daily.   04/03/2018 at Unknown time  . promethazine (PHENERGAN) 25 MG tablet Take 1 tablet (25 mg total) by mouth every 6 (six) hours as needed for nausea or vomiting. 30 tablet 0 More than a month at Unknown time  . scopolamine (TRANSDERM-SCOP) 1 MG/3DAYS  Place 1 patch (1.5 mg total) onto the skin every 3 (three) days. 10 patch 1 More than a month at Unknown time    Results for orders placed or performed during the hospital encounter of 04/03/18 (from the past 48 hour(s))  Wet prep, genital     Status: Abnormal   Collection Time: 04/04/18 12:28 AM  Result Value Ref Range   Yeast Wet Prep HPF POC NONE SEEN NONE SEEN   Trich, Wet Prep NONE SEEN NONE SEEN   Clue Cells Wet Prep HPF POC NONE SEEN NONE SEEN   WBC, Wet Prep HPF POC FEW (A) NONE SEEN    Comment: MANY BACTERIA SEEN   Sperm NONE SEEN     Comment: Performed at [facility], [address], [place], [place] [address]  Comprehensive metabolic panel     Status: Abnormal   Collection Time: 04/04/18  1:25 AM  Result Value Ref Range   Sodium 135 135 - 145 mmol/L   Potassium 4.9 3.5 - 5.1 mmol/L    Comment: SPECIMEN HEMOLYZED. HEMOLYSIS MAY AFFECT INTEGRITY OF RESULTS.   Chloride 105 98 - 111 mmol/L   CO2 22 22 - 32 mmol/L   Glucose, Bld 142 (H) 70 - 99 mg/dL   BUN 8 6 - 20  mg/dL   Creatinine, Ser 5.78 0.44 - 1.00 mg/dL   Calcium 8.5 (L) 8.9 - 10.3 mg/dL   Total Protein 5.3 (L) 6.5 - 8.1 g/dL   Albumin 2.4 (L) 3.5 - 5.0 g/dL   AST 73 (H) 15 - 41 U/L   ALT 20 0 - 44 U/L   Alkaline Phosphatase 126 38 - 126 U/L   Total Bilirubin 1.4 (H) 0.3 - 1.2 mg/dL   GFR calc non Af Amer >60 >60 mL/min   GFR calc Af Amer >60 >60 mL/min   Anion gap 8 5 - 15    Comment: Performed at [facility], [address] [place], [place] [address]  CBC with Differential/Platelet     Status: Abnormal   Collection Time: 04/04/18  1:25 AM  Result Value Ref Range   WBC 16.2 (H) 4.0 - 10.5 K/uL   RBC 3.91 3.87 - 5.11 MIL/uL   Hemoglobin 10.9 (L) 12.0 - 15.0 g/dL   HCT 95.2 (L) 84.1 - 32.4 %   MCV 89.5 80.0 - 100.0 fL   MCH 27.9 26.0 - 34.0 pg   MCHC 31.1 30.0 - 36.0 g/dL   RDW 40.1 02.7 - 25.3 %   Platelets 252 150 - 400 K/uL   nRBC 0.0 0.0 - 0.2 %   Neutrophils Relative % 87 %   Neutro  Abs 14.2 (H) 1.7 - 7.7 K/uL   Lymphocytes Relative 7 %   Lymphs Abs 1.1 0.7 - 4.0 K/uL   Monocytes Relative 5 %   Monocytes Absolute 0.8 0.1 - 1.0 K/uL   Eosinophils Relative 0 %   Eosinophils Absolute 0.0 0.0 - 0.5 K/uL   Basophils Relative 0 %   Basophils Absolute 0.0 0.0 - 0.1 K/uL   Immature Granulocytes 1 %   Abs Immature Granulocytes 0.09 (H) 0.00 - 0.07 K/uL    Comment: Performed at [facility], [address] [place], [place] [address]  Type and screen [facility]     Status: None   Collection Time: 04/04/18  1:26 AM  Result Value Ref Range   ABO/RH(D) B NEG    Antibody Screen POS    Sample Expiration      04/07/2018 Performed at [facility], [address] [place], [place] [address]     US Abdomen Limited Ruq  Result Date: 04/04/2018 CLINICAL DATA:  RIGHT upper quadrant pain. Third trimester pregnancy. EXAM: ULTRASOUND ABDOMEN LIMITED RIGHT UPPER QUADRANT COMPARISON:  None. FINDINGS: Gallbladder: Multiple echogenic gallstones measuring to 5 mm with acoustic shadowing. No gallbladder wall thickening or pericholecystic fluid. However, sonographic Eulah Pont sign was elicited. Common bile duct: Diameter: 4 mm. Liver: No focal lesion identified. Within normal limits in parenchymal echogenicity. Portal vein is patent on color Doppler imaging with normal direction of blood flow towards the liver. IMPRESSION: 1. Cholelithiasis and sonographic Murphy sign suspicious for acute cholecystitis; no corroborated findings. Electronically Signed   By: Awilda Metro M.D.   On: 04/04/2018 01:06    Review of Systems  Gastrointestinal: Positive for abdominal pain, nausea and vomiting.  Genitourinary:       Pregnant  All other systems reviewed and are negative.     Blood pressure 119/66, pulse 75, temperature 98.1 F (36.7 C), temperature source Oral, resp. rate 20, height  (1.702 m), weight 106.1 kg, last menstrual period 08/11/2017, SpO2 100 %.    Physical Exam  Constitutional: She is oriented to person, place, and time. She  appears well-developed and well-nourished. No distress.  HENT:  Head: Normocephalic and atraumatic.  Eyes: Pupils are equal, round, and reactive to light. Conjunctivae are normal. No scleral icterus.  Neck: Normal range of motion. Neck supple. No tracheal deviation present.  Cardiovascular: Normal rate, regular rhythm, normal heart sounds and intact distal pulses. Exam reveals no gallop and no friction rub.  No murmur heard. Respiratory: Effort normal and breath sounds normal. No respiratory distress. She exhibits no tenderness.  GI: Soft. Bowel sounds are normal. She exhibits no distension. There is no abdominal tenderness. There is no rebound and no guarding.  gravid  Musculoskeletal:        General: No tenderness, deformity or edema.  Neurological: She is alert and oriented to person, place, and time. Coordination normal.  Skin: Skin is warm and dry. No rash noted. She is not diaphoretic. No erythema. No pallor.  Psychiatric: She has a normal mood and affect. Her behavior is normal. Judgment and thought content normal.     Assessment/Plan: Cholelithiasis and probable early cholecystitis + leukocytosis Gravid at  33 and 5/7  IV antibiotics. Recheck labs.   Tenderness has resolved for now.  If bili is coming down, would continue antibiotics.  Will likely get steroids for fetal lung maturation  If she has rise in bili, could need MRCP and possible ercp.   Will d/w Dr. Magnus Ivan.    If pain settles down, may be able to get her to term prior to cholecystectomy.    Almond Lint 04/04/2018, 3:09 AM

## 2018-04-04 NOTE — Progress Notes (Signed)
Paged general surgery again for suspicion of Cholecystitis.

## 2018-04-04 NOTE — Plan of Care (Signed)
  Problem: Education: Goal: Knowledge of General Education information will improve Description Including pain rating scale, medication(s)/side effects and non-pharmacologic comfort measures Outcome: Completed/Met

## 2018-04-04 NOTE — Plan of Care (Signed)
  Problem: Nutrition: Goal: Adequate nutrition will be maintained Outcome: Progressing   Problem: Pain Managment: Goal: General experience of comfort will improve Outcome: Progressing   

## 2018-04-05 ENCOUNTER — Encounter (HOSPITAL_COMMUNITY): Payer: Self-pay

## 2018-04-05 LAB — COMPREHENSIVE METABOLIC PANEL
ALT: 25 U/L (ref 0–44)
AST: 27 U/L (ref 15–41)
Albumin: 2 g/dL — ABNORMAL LOW (ref 3.5–5.0)
Alkaline Phosphatase: 170 U/L — ABNORMAL HIGH (ref 38–126)
Anion gap: 8 (ref 5–15)
CO2: 23 mmol/L (ref 22–32)
CREATININE: 0.59 mg/dL (ref 0.44–1.00)
Calcium: 8.1 mg/dL — ABNORMAL LOW (ref 8.9–10.3)
Chloride: 103 mmol/L (ref 98–111)
GFR calc Af Amer: >60 mL/min (ref >60)
GFR calc non Af Amer: >60 mL/min (ref >60)
Glucose, Bld: 79 mg/dL (ref 70–99)
Potassium: 3.4 mmol/L — ABNORMAL LOW (ref 3.5–5.1)
Sodium: 134 mmol/L — ABNORMAL LOW (ref 135–145)
Total Bilirubin: 0.8 mg/dL (ref 0.3–1.2)
Total Protein: 5.3 g/dL — ABNORMAL LOW (ref 6.5–8.1)

## 2018-04-05 LAB — CBC
HCT: 28.8 % — ABNORMAL LOW (ref 36.0–46.0)
Hemoglobin: 9.1 g/dL — ABNORMAL LOW (ref 12.0–15.0)
MCH: 28.1 pg (ref 26.0–34.0)
MCHC: 31.6 g/dL (ref 30.0–36.0)
MCV: 88.9 fL (ref 80.0–100.0)
Platelets: 216 10*3/uL (ref 150–400)
RBC: 3.24 MIL/uL — ABNORMAL LOW (ref 3.87–5.11)
RDW: 14 % (ref 11.5–15.5)
WBC: 9.4 10*3/uL (ref 4.0–10.5)
nRBC: 0 % (ref 0.0–0.2)

## 2018-04-05 MED ORDER — ONDANSETRON 4 MG PO TBDP
4.0000 mg | ORAL_TABLET | Freq: Four times a day (QID) | ORAL | Status: DC | PRN
  Administered 2018-04-05 – 2018-04-06 (×2): 4 mg via ORAL
  Filled 2018-04-05 (×2): qty 1

## 2018-04-05 NOTE — Progress Notes (Signed)
Tolerating diet-some epigastric tightness when up on feet Nausea relieved with Zofran (had been using B6 and some Zofran through pregnancy for nausea)  Today's Vitals   04/05/18 1156 04/05/18 1237 04/05/18 1258 04/05/18 1543  BP: 100/60   (!) 81/40  Pulse: (!) 106   92  Resp: 18   18  Temp: 98.7 F (37.1 C)   98.7 F (37.1 C)  TempSrc: Oral   Oral  SpO2: 96%   98%  Weight:      Height:      PainSc:  6  4     Body mass index is 36.65 kg/m.   NST reactive  A/P: Cholecystitis         33 6/7 weeks>repeat C/S         Betamethasone for FLM if cholecystectomy scheduled         D/W patient

## 2018-04-05 NOTE — Progress Notes (Signed)
Pt. C/o nausea following breakfast. Both Dr.s Henderson Cloud and Balckman aware. Orders received for prn Zofran. Pt asking about restarting her B6 from home medication regimen. Will hold on that for now per Dr. Henderson Cloud. Plan of care explained to patient-verbalized understanding.

## 2018-04-05 NOTE — Progress Notes (Signed)
   Subjective/Chief Complaint: Patient reports so epigastric and RUQ pain with deep breathing   Objective: Vital signs in last 24 hours: Temp:  [98.3 F (36.8 C)-98.8 F (37.1 C)] 98.4 F (36.9 C) (04/01 0541) Pulse Rate:  [80-100] 100 (04/01 0541) Resp:  [14-18] 17 (04/01 0541) BP: (90-112)/(51-66) 104/58 (04/01 0541) SpO2:  [97 %-100 %] 100 % (03/31 2212)    Intake/Output from previous day: 03/31 0701 - 04/01 0700 In: 3043.1 [P.O.:720; I.V.:2183.6; IV Piggyback:139.6] Out: 1400 [Urine:1400] Intake/Output this shift: No intake/output data recorded.  Exam: Awake and alert Looks comfortable Abdomen gravid, soft in upper abdomin with minimal tenderness  Lab Results:  Recent Labs    04/04/18 0125 04/05/18 0533  WBC 16.2* 9.4  HGB 10.9* 9.1*  HCT 35.0* 28.8*  PLT 252 216   BMET Recent Labs    04/04/18 0942 04/05/18 0533  NA 135 134*  K 4.0 3.4*  CL 104 103  CO2 23 23  GLUCOSE 94 79  BUN 6 <5*  CREATININE 0.61 0.59  CALCIUM 8.5* 8.1*   PT/INR No results for input(s): LABPROT, INR in the last 72 hours. ABG No results for input(s): PHART, HCO3 in the last 72 hours.  Invalid input(s): PCO2, PO2  Studies/Results: US Abdomen Limited Ruq  Result Date: 04/04/2018 CLINICAL DATA:  RIGHT upper quadrant pain. Third trimester pregnancy. EXAM: ULTRASOUND ABDOMEN LIMITED RIGHT UPPER QUADRANT COMPARISON:  None. FINDINGS: Gallbladder: Multiple echogenic gallstones measuring to 5 mm with acoustic shadowing. No gallbladder wall thickening or pericholecystic fluid. However, sonographic Percell Miller sign was elicited. Common bile duct: Diameter: 4 mm. Liver: No focal lesion identified. Within normal limits in parenchymal echogenicity. Portal vein is patent on color Doppler imaging with normal direction of blood flow towards the liver. IMPRESSION: 1. Cholelithiasis and sonographic Murphy sign suspicious for acute cholecystitis; no corroborated findings. Electronically Signed   By:  Elon Alas M.D.   On: 04/04/2018 01:06    Anti-infectives: Anti-infectives (From admission, onward)   Start     Dose/Rate Route Frequency Ordered Stop   04/04/18 0600  piperacillin-tazobactam (ZOSYN) IVPB 3.375 g     3.375 g 12.5 mL/hr over 240 Minutes Intravenous Every 8 hours 04/04/18 0542        Assessment/Plan: Cholecystitis with gallstones  LFT's better except Alk Phos WBC now normal  Will try a soft diet. If she is unable to tolerate this, would have to consider cholecystectomy tomorrow vs Friday. If she remains stable, hopefully she can be discharged on oral antibiotics  LOS: 1 day    Coralie Keens 04/05/2018

## 2018-04-05 NOTE — Progress Notes (Signed)
33 6/7 wks  Reports feeling better Breakfast in room. She has not yet eaten.  Today's Vitals   04/05/18 0541 04/05/18 0552 04/05/18 0621 04/05/18 0805  BP: (!) 104/58   (!) 108/54  Pulse: 100   92  Resp: 17   18  Temp: 98.4 F (36.9 C)   98.8 F (37.1 C)  TempSrc: Oral   Oral  SpO2:    96%  Weight:      Height:      PainSc:  4  3     Body mass index is 36.65 kg/m. Abdomen mild epigastric tenderness, no rebound     Uterus soft, NT  FHT cat one UCs irritability  A/P: - 33 6/7wks-+FWB, stable from obstetric standpoint           Repeat C/S for delivery          -cholecystitis-plan per gen surgery           If cholecystectomy planned will order betamethasone for FLM           D/W patient

## 2018-04-05 NOTE — Plan of Care (Signed)
  Problem: Pain Managment: Goal: General experience of comfort will improve Outcome: Progressing   

## 2018-04-06 ENCOUNTER — Inpatient Hospital Stay (HOSPITAL_COMMUNITY): Payer: 59 | Admitting: Certified Registered"

## 2018-04-06 ENCOUNTER — Encounter (HOSPITAL_COMMUNITY)
Admission: EM | Disposition: A | Discharge status: Home / Self Care | Payer: Self-pay | Source: Home / Self Care | Attending: Obstetrics & Gynecology

## 2018-04-06 ENCOUNTER — Encounter (HOSPITAL_COMMUNITY): Payer: Self-pay | Admitting: Certified Registered"

## 2018-04-06 HISTORY — PX: CHOLECYSTECTOMY: SHX55

## 2018-04-06 SURGERY — LAPAROSCOPIC CHOLECYSTECTOMY WITH INTRAOPERATIVE CHOLANGIOGRAM
Anesthesia: General | Site: Abdomen

## 2018-04-06 MED ORDER — MORPHINE SULFATE (PF) 4 MG/ML IV SOLN
1.0000 mg | INTRAVENOUS | Status: DC | PRN
  Administered 2018-04-06: 1 mg via INTRAVENOUS
  Filled 2018-04-06: qty 1

## 2018-04-06 MED ORDER — MIDAZOLAM HCL 2 MG/2ML IJ SOLN
INTRAMUSCULAR | Status: AC
  Filled 2018-04-06: qty 2

## 2018-04-06 MED ORDER — LIDOCAINE 2% (20 MG/ML) 5 ML SYRINGE
INTRAMUSCULAR | Status: DC | PRN
  Administered 2018-04-06: 60 mg via INTRAVENOUS

## 2018-04-06 MED ORDER — BUPIVACAINE-EPINEPHRINE 0.25% -1:200000 IJ SOLN
INTRAMUSCULAR | Status: DC | PRN
  Administered 2018-04-06: 20 mL

## 2018-04-06 MED ORDER — TRAMADOL HCL 50 MG PO TABS
50.0000 mg | ORAL_TABLET | Freq: Four times a day (QID) | ORAL | Status: DC | PRN
  Administered 2018-04-06 – 2018-04-07 (×2): 50 mg via ORAL
  Filled 2018-04-06 (×2): qty 1

## 2018-04-06 MED ORDER — ROCURONIUM BROMIDE 50 MG/5ML IV SOSY
PREFILLED_SYRINGE | INTRAVENOUS | Status: DC | PRN
  Administered 2018-04-06: 50 mg via INTRAVENOUS

## 2018-04-06 MED ORDER — FENTANYL CITRATE (PF) 100 MCG/2ML IJ SOLN
25.0000 ug | INTRAMUSCULAR | Status: DC | PRN
  Administered 2018-04-06 (×3): 25 ug via INTRAVENOUS

## 2018-04-06 MED ORDER — ONDANSETRON HCL 4 MG/2ML IJ SOLN
INTRAMUSCULAR | Status: DC | PRN
  Administered 2018-04-06: 4 mg via INTRAVENOUS

## 2018-04-06 MED ORDER — FENTANYL CITRATE (PF) 250 MCG/5ML IJ SOLN
INTRAMUSCULAR | Status: DC | PRN
  Administered 2018-04-06: 100 ug via INTRAVENOUS
  Administered 2018-04-06: 50 ug via INTRAVENOUS

## 2018-04-06 MED ORDER — SUGAMMADEX SODIUM 200 MG/2ML IV SOLN
INTRAVENOUS | Status: DC | PRN
  Administered 2018-04-06: 200 mg via INTRAVENOUS

## 2018-04-06 MED ORDER — ONDANSETRON HCL 4 MG/2ML IJ SOLN: 4.0000 mg | Freq: Once | INTRAMUSCULAR | Status: DC | PRN

## 2018-04-06 MED ORDER — BUPIVACAINE-EPINEPHRINE 0.25% -1:200000 IJ SOLN
INTRAMUSCULAR | Status: AC
  Filled 2018-04-06: qty 1

## 2018-04-06 MED ORDER — SODIUM CHLORIDE 0.9 % IR SOLN
Status: DC | PRN
  Administered 2018-04-06: 1000 mL

## 2018-04-06 MED ORDER — 0.9 % SODIUM CHLORIDE (POUR BTL) OPTIME
TOPICAL | Status: DC | PRN
  Administered 2018-04-06: 11:00:00 1000 mL

## 2018-04-06 MED ORDER — BETAMETHASONE SOD PHOS & ACET 6 (3-3) MG/ML IJ SUSP
12.0000 mg | Freq: Once | INTRAMUSCULAR | Status: AC
  Administered 2018-04-06: 12 mg via INTRAMUSCULAR
  Filled 2018-04-06: qty 2

## 2018-04-06 MED ORDER — HEMOSTATIC AGENTS (NO CHARGE) OPTIME
TOPICAL | Status: DC | PRN
  Administered 2018-04-06: 1 via TOPICAL

## 2018-04-06 MED ORDER — PROPOFOL 10 MG/ML IV BOLUS
INTRAVENOUS | Status: AC
  Filled 2018-04-06: qty 20

## 2018-04-06 MED ORDER — PROPOFOL 10 MG/ML IV BOLUS
INTRAVENOUS | Status: DC | PRN
  Administered 2018-04-06: 160 mg via INTRAVENOUS

## 2018-04-06 MED ORDER — FENTANYL CITRATE (PF) 250 MCG/5ML IJ SOLN
INTRAMUSCULAR | Status: AC
  Filled 2018-04-06: qty 5

## 2018-04-06 MED ORDER — FENTANYL CITRATE (PF) 100 MCG/2ML IJ SOLN
INTRAMUSCULAR | Status: AC
  Filled 2018-04-06: qty 2

## 2018-04-06 MED ORDER — PHENYLEPHRINE 40 MCG/ML (10ML) SYRINGE FOR IV PUSH (FOR BLOOD PRESSURE SUPPORT)
PREFILLED_SYRINGE | INTRAVENOUS | Status: DC | PRN
  Administered 2018-04-06: 80 ug via INTRAVENOUS

## 2018-04-06 SURGICAL SUPPLY — 37 items
APPLIER CLIP 5 13 M/L LIGAMAX5 (MISCELLANEOUS) ×2
BLADE CLIPPER SURG (BLADE) IMPLANT
CANISTER SUCT 3000ML PPV (MISCELLANEOUS) ×2 IMPLANT
CHLORAPREP W/TINT 26ML (MISCELLANEOUS) ×2 IMPLANT
CLIP APPLIE 5 13 M/L LIGAMAX5 (MISCELLANEOUS) ×1 IMPLANT
COVER MAYO STAND STRL (DRAPES) IMPLANT
COVER SURGICAL LIGHT HANDLE (MISCELLANEOUS) ×2 IMPLANT
COVER WAND RF STERILE (DRAPES) IMPLANT
DERMABOND ADVANCED (GAUZE/BANDAGES/DRESSINGS) ×1
DERMABOND ADVANCED .7 DNX12 (GAUZE/BANDAGES/DRESSINGS) ×1 IMPLANT
DRAPE C-ARM 42X72 X-RAY (DRAPES) IMPLANT
ELECT REM PT RETURN 9FT ADLT (ELECTROSURGICAL) ×2
ELECTRODE REM PT RTRN 9FT ADLT (ELECTROSURGICAL) ×1 IMPLANT
GLOVE SURG SIGNA 7.5 PF LTX (GLOVE) ×2 IMPLANT
GOWN STRL REUS W/ TWL LRG LVL3 (GOWN DISPOSABLE) ×2 IMPLANT
GOWN STRL REUS W/ TWL XL LVL3 (GOWN DISPOSABLE) ×1 IMPLANT
GOWN STRL REUS W/TWL LRG LVL3 (GOWN DISPOSABLE) ×2
GOWN STRL REUS W/TWL XL LVL3 (GOWN DISPOSABLE) ×1
HEMOSTAT SNOW SURGICEL 2X4 (HEMOSTASIS) ×2 IMPLANT
KIT BASIN OR (CUSTOM PROCEDURE TRAY) ×2 IMPLANT
KIT TURNOVER KIT B (KITS) ×2 IMPLANT
NS IRRIG 1000ML POUR BTL (IV SOLUTION) ×2 IMPLANT
PAD ARMBOARD 7.5X6 YLW CONV (MISCELLANEOUS) ×2 IMPLANT
POUCH SPECIMEN RETRIEVAL 10MM (ENDOMECHANICALS) ×2 IMPLANT
SCISSORS LAP 5X35 DISP (ENDOMECHANICALS) ×2 IMPLANT
SET CHOLANGIOGRAPH 5 50 .035 (SET/KITS/TRAYS/PACK) IMPLANT
SET IRRIG TUBING LAPAROSCOPIC (IRRIGATION / IRRIGATOR) ×2 IMPLANT
SET TUBE SMOKE EVAC HIGH FLOW (TUBING) ×2 IMPLANT
SLEEVE ENDOPATH XCEL 5M (ENDOMECHANICALS) ×4 IMPLANT
SPECIMEN JAR SMALL (MISCELLANEOUS) ×2 IMPLANT
SUT MNCRL AB 4-0 PS2 18 (SUTURE) ×4 IMPLANT
TOWEL OR 17X24 6PK STRL BLUE (TOWEL DISPOSABLE) ×2 IMPLANT
TOWEL OR 17X26 10 PK STRL BLUE (TOWEL DISPOSABLE) ×2 IMPLANT
TRAY LAPAROSCOPIC MC (CUSTOM PROCEDURE TRAY) ×2 IMPLANT
TROCAR XCEL BLUNT TIP 100MML (ENDOMECHANICALS) ×2 IMPLANT
TROCAR XCEL NON-BLD 5MMX100MML (ENDOMECHANICALS) ×2 IMPLANT
WATER STERILE IRR 1000ML POUR (IV SOLUTION) ×2 IMPLANT

## 2018-04-06 NOTE — Discharge Instructions (Signed)
[facility] ° °Please arrive at least 30 min before your appointment to complete your check in paperwork.  If you are unable to arrive 30 min prior to your appointment time we may have to cancel or reschedule you. °LAPAROSCOPIC SURGERY: POST OP INSTRUCTIONS °Always review your discharge instruction sheet given to you by the facility where your surgery was performed. °IF YOU HAVE DISABILITY OR FAMILY LEAVE FORMS, YOU MUST BRING THEM TO THE OFFICE FOR PROCESSING.   °DO NOT GIVE THEM TO YOUR DOCTOR. ° °PAIN CONTROL ° °1. First take acetaminophen (Tylenol) AND/or ibuprofen (Advil) to control your pain after surgery.  Follow directions on package.  Taking acetaminophen (Tylenol) and/or ibuprofen (Advil) regularly after surgery will help to control your pain and lower the amount of prescription pain medication you may need.  You should not take more than 4,000 mg (4 grams) of acetaminophen (Tylenol) in 24 hours.  You should not take ibuprofen (Advil), aleve, motrin, naprosyn or other NSAIDS if you have a history of stomach ulcers or chronic kidney disease.  °2. A prescription for pain medication may be given to you upon discharge.  Take your pain medication as prescribed, if you still have uncontrolled pain after taking acetaminophen (Tylenol) or ibuprofen (Advil). °3. Use ice packs to help control pain. °4. If you need a refill on your pain medication, please contact your pharmacy.  They will contact our office to request authorization. Prescriptions will not be filled after 5pm or on week-ends. ° °HOME MEDICATIONS °5. Take your usually prescribed medications unless otherwise directed. ° °DIET °6. You should follow a light diet the first few days after arrival home.  Be sure to include lots of fluids daily. Avoid fatty, fried foods.  ° °CONSTIPATION °7. It is common to experience some constipation after surgery and if you are taking pain medication.  Increasing fluid intake and taking a stool  softener (such as Colace) will usually help or prevent this problem from occurring.  A mild laxative (Milk of Magnesia or Miralax) should be taken according to package instructions if there are no bowel movements after 48 hours. ° °WOUND/INCISION CARE °8. Most patients will experience some swelling and bruising in the area of the incisions.  Ice packs will help.  Swelling and bruising can take several days to resolve.  °9. Unless discharge instructions indicate otherwise, follow guidelines below  °a. STERI-STRIPS - you may remove your outer bandages 48 hours after surgery, and you may shower at that time.  You have steri-strips (small skin tapes) in place directly over the incision.  These strips should be left on the skin for 7-10 days.   °b. DERMABOND/SKIN GLUE - you may shower in 24 hours.  The glue will flake off over the next 2-3 weeks. °10. Any sutures or staples will be removed at the office during your follow-up visit. ° °ACTIVITIES °11. You may resume regular (light) daily activities beginning the next day--such as daily self-care, walking, climbing stairs--gradually increasing activities as tolerated.  You may have sexual intercourse when it is comfortable.  Refrain from any heavy lifting or straining until approved by your doctor. °a. You may drive when you are no longer taking prescription pain medication, you can comfortably wear a seatbelt, and you can safely maneuver your car and apply brakes. ° °FOLLOW-UP °12. You should see your doctor in the office for a follow-up appointment approximately 2-3 weeks after your surgery.  You should have been given your post-op/follow-up appointment when   your surgery was scheduled.  If you did not receive a post-op/follow-up appointment, make sure that you call for this appointment within a day or two after you arrive home to insure a convenient appointment time. ° °OTHER INSTRUCTIONS ° °WHEN TO CALL YOUR DOCTOR: °1. Fever over 101.0 °2. Inability to  urinate °3. Continued bleeding from incision. °4. Increased pain, redness, or drainage from the incision. °5. Increasing abdominal pain ° °The clinic staff is available to answer your questions during regular business hours.  Please don’t hesitate to call and ask to speak to one of the nurses for clinical concerns.  If you have a medical emergency, go to the nearest emergency room or call 911.  A surgeon from [facility] is always on call at the hospital. °[address], [address], [place], [place]  [address] ? [address] [address], [place], [place]   [address] °[phone] ? [phone] ? FAX [phone] ° ° ° °

## 2018-04-06 NOTE — Progress Notes (Signed)
[facility] RN has just completed monitoring on 34 wk pt post op who had lap cholecystectomy.  FHR is now reactive and reassuring. Dr Vincente Poli has been notified. [facility] to perform another NST later in the day. Megan,rn given report

## 2018-04-06 NOTE — Transfer of Care (Signed)
Immediate Anesthesia Transfer of Care Note  Patient: Kimberly Kirk  Procedure(s) Performed: LAPAROSCOPIC CHOLECYSTECTOMY (N/A Abdomen)  Patient Location: PACU  Anesthesia Type:General  Level of Consciousness: drowsy  Airway & Oxygen Therapy: Patient Spontanous Breathing and Patient connected to face mask oxygen  Post-op Assessment: Report given to RN and Post -op Vital signs reviewed and stable  Post vital signs: Reviewed and stable  Last Vitals:  Vitals Value Taken Time  BP 114/72 04/06/2018 11:54 AM  Temp    Pulse 93 04/06/2018 11:55 AM  Resp 19 04/06/2018 11:55 AM  SpO2 96 % 04/06/2018 11:55 AM  Vitals shown include unvalidated device data.  Last Pain:  Vitals:   04/06/18 0915  TempSrc:   PainSc: 2       Patients Stated Pain Goal: 2 (04/05/18 2029)  Complications: No apparent anesthesia complications

## 2018-04-06 NOTE — Progress Notes (Signed)
Patient ID: Kimberly Kirk, female   DOB: 1985-08-15, 33 y.o.   MRN: [id number]  Clinically, she is not better.  Pain persists in the epigastrium and RUQ as well as tenderness. We recommend proceeding with a cholecystectomy. I discussed the risks of a laparoscopic cholecystectomy with cholangiogram.   I discussed the procedure in detail.  We discussed the risks which include but are not limited to bleeding, infection, injury to surrounding structures , need to convert to an open procedure,  DVT, common bile duct injury, anesthesia risks, and possible need for additional procedures as well as preterm labor.  I told nursing to notify the primary team regarding any meds the want to give preop. Surgery is scheduled for this morning

## 2018-04-06 NOTE — Anesthesia Preprocedure Evaluation (Signed)

## 2018-04-06 NOTE — Progress Notes (Addendum)
[facility] Nurse contacted in regards to patients 1000 surgery   9:20 AM  Spoke to the [facility] RN patient is currently on the monitor for fetal heart tones.  This monitoring will count as the Pre-op Monitoring.  The [facility] RN will need to be contacted in PACU for post op monitoring.    [facility] number [id number]

## 2018-04-06 NOTE — Progress Notes (Signed)
Appreciate Dr. Eliberto Ivory care Agree with plan to proceed with [facility] Chole  BP (!) 95/56 (BP Location: Left Arm)   Pulse 100   Temp 98.7 F (37.1 C) (Oral)   Resp 18   Ht 5\' 7"  (1.702 m)   Wt 106.1 kg   LMP 08/11/2017   SpO2 98%   BMI 36.65 kg/m  No results found for this or any previous visit (from the past 24 hour(s)).  IUP at 34 weeks Acute cholecystitis   To have LSC Chole today  Recommend betamethasone today   Discussed with patient risks of surgery , risk of C Section   Monitoring per protocol post op

## 2018-04-06 NOTE — Anesthesia Postprocedure Evaluation (Signed)
Anesthesia Post Note  Patient: Kimberly Kirk  Procedure(s) Performed: LAPAROSCOPIC CHOLECYSTECTOMY (N/A Abdomen)     Patient location during evaluation: PACU Anesthesia Type: General Level of consciousness: awake and alert Pain management: pain level controlled Vital Signs Assessment: post-procedure vital signs reviewed and stable Respiratory status: spontaneous breathing, nonlabored ventilation, respiratory function stable and patient connected to nasal cannula oxygen Cardiovascular status: blood pressure returned to baseline and stable Postop Assessment: no apparent nausea or vomiting Anesthetic complications: no    Last Vitals:  Vitals:   04/06/18 0759 04/06/18 1154  BP: (!) 95/56 114/72  Pulse: 100 92  Resp: 18 18  Temp: 37.1 C (!) 36.4 C  SpO2: 98% 93%    Last Pain:  Vitals:   04/06/18 1154  TempSrc:   PainSc: Asleep                 Emanuel Dowson COKER

## 2018-04-06 NOTE — Anesthesia Procedure Notes (Signed)
Procedure Name: Intubation Date/Time: 04/06/2018 10:49 AM Performed by: Elliot Dally, CRNA Pre-anesthesia Checklist: Patient identified, Emergency Drugs available, Suction available and Patient being monitored Patient Re-evaluated:Patient Re-evaluated prior to induction Oxygen Delivery Method: Circle System Utilized Preoxygenation: Pre-oxygenation with 100% oxygen Induction Type: IV induction, Rapid sequence and Cricoid Pressure applied Laryngoscope Size: Glidescope and 3 Grade View: Grade I Tube type: Oral Tube size: 7.0 mm Number of attempts: 1 Airway Equipment and Method: Stylet and Oral airway Placement Confirmation: ETT inserted through vocal cords under direct vision,  positive ETCO2 and breath sounds checked- equal and bilateral Secured at: 22 cm Tube secured with: Tape Dental Injury: Teeth and Oropharynx as per pre-operative assessment  Comments: Elective glide scope intubation. [redacted] weeks gestation

## 2018-04-06 NOTE — Op Note (Addendum)
LAPAROSCOPIC CHOLECYSTECTOMY  Procedure Note  Kimberly Kirk 04/03/2018 - 04/06/2018   Pre-op Diagnosis:  Acute cholecystitis with cholelithiasis     Post-op Diagnosis: same  Procedure(s): LAPAROSCOPIC CHOLECYSTECTOMY  Surgeon(s): Abigail Miyamoto, MD  Emelia Loron MD  Anesthesia: General  Staff:  Circulator: Patrica Duel, RN Relief Circulator: Woodroe Mode, RN Scrub Person: Josefa Half, RN; Vickii Penna, [place] T  Estimated Blood Loss: Minimal               Specimens: sent to path  Indications: This is a 33 year old female who is 55 to [redacted] weeks pregnant who presented with acute cholecystitis.  Her liver function tests have almost returned to normal but she continues to have discomfort and nausea so decision was made to proceed with cholecystectomy  Procedure: The patient was brought to the operating room and identified as correct patient.  She was placed upon the operating table and general anesthesia was induced.  I made a vertical incision several inches above the umbilicus with a scalpel.  I then took this down to the fascia which was then opened with a scalpel.  Hemostat was then used to pass into the peritoneal cavity under direct vision.  With the assigned port was placed the opening and insufflation of the abdomen was begun.  I could easily visualize the gravid uterus as well as the gallbladder and liver.  The gallbladder was inflamed.  I placed a 5 mm trocar in the patient's epigastrium and 2 more in the right upper quadrant all under direct vision.  The gallbladder was then grasped and retracted above the liver bed.  The cystic duct was dissected out and a critical window was achieved around it.  The cystic artery was likewise dissected out as well.  The cystic duct was foreshortened so the decision was made to forego cholangiogram.  The cystic duct and cystic artery were then clipped proximally and distally and transected with scissors.  A  posterior branch of the cystic artery was also clipped.  The gallbladder was then slowly dissected free from the liver bed with electrocautery.  Once it was free from the liver bed hemostasis appeared to be achieved.  I did still place a piece of surgical snow into the gallbladder fossa.  The gallbladder was then placed in an Endosac and removed through the incision at the umbilicus.  We again evaluated the liver bed hemostasis felt to be achieved.  All ports were then removed under direct vision after the abdomen was deflated.  0 Vicryl at the umbilicus was tied in place closing the fascial defect.  All incisions were then anesthetized with Marcaine and closed with 4-0 Monocryl sutures and Dermabond.  The patient tolerated procedure well.  All the counts were correct at the end of procedure.  The patient was then extubated in the operating room and taken in stable addition to the recovery room.          Abigail Miyamoto   Date: 04/06/2018  Time: 11:31 AM

## 2018-04-07 ENCOUNTER — Encounter (HOSPITAL_COMMUNITY): Payer: Self-pay | Admitting: Surgery

## 2018-04-07 MED ORDER — TRAMADOL HCL 50 MG PO TABS: 50.0000 mg | ORAL_TABLET | Freq: Four times a day (QID) | ORAL | 0 refills | Status: DC | PRN

## 2018-04-07 NOTE — Progress Notes (Signed)
1 Day Post-Op   Subjective/Chief Complaint: Doing well comfortable   Objective: Vital signs in last 24 hours: Temp:  [97.5 F (36.4 C)-99.4 F (37.4 C)] 97.6 F (36.4 C) (04/03 0353) Pulse Rate:  [80-100] 80 (04/03 0353) Resp:  [14-57] 16 (04/03 0353) BP: (95-125)/(55-80) 107/63 (04/03 0353) SpO2:  [89 %-99 %] 96 % (04/03 0353)    Intake/Output from previous day: 04/02 0701 - 04/03 0700 In: 1400 [I.V.:1400] Out: 50 [Blood:50] Intake/Output this shift: No intake/output data recorded.  Exam: Awake and alert Abdomen soft, incisions clean  Lab Results:  Recent Labs    04/05/18 0533  WBC 9.4  HGB 9.1*  HCT 28.8*  PLT 216   BMET Recent Labs    04/04/18 0942 04/05/18 0533  NA 135 134*  K 4.0 3.4*  CL 104 103  CO2 23 23  GLUCOSE 94 79  BUN 6 <5*  CREATININE 0.61 0.59  CALCIUM 8.5* 8.1*   PT/INR No results for input(s): LABPROT, INR in the last 72 hours. ABG No results for input(s): PHART, HCO3 in the last 72 hours.  Invalid input(s): PCO2, PO2  Studies/Results: No results found.  Anti-infectives: Anti-infectives (From admission, onward)   Start     Dose/Rate Route Frequency Ordered Stop   04/04/18 0600  piperacillin-tazobactam (ZOSYN) IVPB 3.375 g     3.375 g 12.5 mL/hr over 240 Minutes Intravenous Every 8 hours 04/04/18 0542        Assessment/Plan: s/p Procedure(s): LAPAROSCOPIC CHOLECYSTECTOMY (N/A)  Ok for discharge home from surgery standpoint  LOS: 3 days    Abigail Miyamoto 04/07/2018

## 2018-04-07 NOTE — Progress Notes (Signed)
Pt discharged to home with husband.  Condition stable.  Discharge instructions reviewed with pt and husband together.  Pt to car via wheelchair with Garlan Fair, NT.  No equipment for home ordered at discharge.

## 2018-04-07 NOTE — Discharge Summary (Signed)
Physician Discharge Summary  Patient ID: Kimberly Kirk MRN: [id number] DOB/AGE: Dec 17, 1985 33 y.o.  Admit date: 04/03/2018 Discharge date: 04/07/2018  Admission Diagnoses:  RUQ pain  Discharge Diagnoses:  Active Problems:   Cholecystitis   Discharged Condition: good  Hospital Course: Pt was admitted with pain and diagnosed with cholecystitis.  Pt was taken to OR for cholecystectomy.  She did well after surgery and was stable for discharge.    Consults: general surgery  Significant Diagnostic Studies: labs: cbc  Treatments: surgery: chole  Discharge Exam: Blood pressure (!) 99/57, pulse 83, temperature 98.3 F (36.8 C), temperature source Oral, resp. rate 16, height 5\' 7"  (1.702 m), weight 106.1 kg, last menstrual period 08/11/2017, SpO2 96 %. General appearance: alert and cooperative GI: normal findings: gravid, NT  Inc c/d/i  Disposition: Discharge disposition: 01-Home or Self Care         Follow-up Information    [facility], [place]. Call on 04/20/2018.   Specialty:  General Surgery Why:  Your appt is 04/16 at 2 pm. We are attempting to limit foot traffic in the office due to coronavirus. Instead of coming to your appt a provider will call you at the above date/time. Send picture of incision to [email or web address] Contact information: [address] [place] [address] [phone]       Ginette Otto, [facility] Follow up in 1 week(s).   Contact information: [address] [place] [address] [phone]           Signed: Zelphia Cairo 04/07/2018, 8:36 AM

## 2018-04-08 LAB — TYPE AND SCREEN
ABO/RH(D): B NEG
ANTIBODY SCREEN: POSITIVE
UNIT DIVISION: 0
Unit division: 0

## 2018-04-08 LAB — BPAM RBC
Blood Product Expiration Date: 202004272359
Blood Product Expiration Date: 202004272359
Unit Type and Rh: 9500
Unit Type and Rh: 9500

## 2018-04-25 NOTE — H&P (Addendum)
Kimberly Kirk is a 33 y.o. female presenting for rCS with BTL. Pregnancy c/b hyperemesis which resolved and RH negative. She received Rhogam. She underwent a LSC cholecystectomy during this pregnancy at approximately 56 wga that was uncomplicated.     OB History    Gravida  3   Para  2   Term  2   Preterm      AB      Living  2     SAB      TAB      Ectopic      Multiple      Live Births  2          Past Medical History:  Diagnosis Date  . Medical history non-contributory    Past Surgical History:  Procedure Laterality Date  . CESAREAN SECTION     x 2  . CHOLECYSTECTOMY N/A 04/06/2018   Procedure: LAPAROSCOPIC CHOLECYSTECTOMY;  Surgeon: Abigail Miyamoto, MD;  Location: [facility] OR;  Service: General;  Laterality: N/A;  . TONSILLECTOMY    . WISDOM TOOTH EXTRACTION     Family History: family history includes Diabetes in an other family member. Social History:  reports that she has never smoked. She has never used smokeless tobacco. She reports that she does not drink alcohol or use drugs.     Maternal Diabetes: No Genetic Screening: Normal Maternal Ultrasounds/Referrals: Normal Fetal Ultrasounds or other Referrals:  None Maternal Substance Abuse:  No Significant Maternal Medications:  None Significant Maternal Lab Results:  None Other Comments:  None  ROS History   Last menstrual period 08/11/2017. Exam Physical Exam  Prenatal labs: ABO, Rh: --/--/B NEG (03/31 0126) Antibody: POS (03/31 0126) Rubella:   RPR:    HBsAg:    HIV:    GBS:     Assessment/Plan: 33 yo G3P2002 @ 72 wga presenting for scheduled repeat CS with requested bilateral tubal ligation. Risks discussed including infection, bleeding, damage to surrounding structures, the need for additional procedures including hysterectomy, and the possibility of uterine rupture with neonatal morbidity/mortality, scarring, and abnormal placentation with subsequent pregnancies. We also discussed  bilateral tubal ligation in detail. The alternatives to permanent sterilization were reviewed and it was discussed that this is considered permanent. We reviewed the risks in detail including regret, failure and ectopic pregnancy. She understands and agrees to proceed. 2g ancef on call to OR.  This is a rCS x 2. Expecting female - "Kimberly Kirk".     Ranae Pila 04/25/2018, 8:59 AM   No updates to above H&P. Patient arrived NPO and was consented in PACU. Risks again discussed, all questions answered, and consent signed. Proceed with above surgery.   Belva Agee MD

## 2018-05-03 ENCOUNTER — Encounter (HOSPITAL_COMMUNITY): Payer: Self-pay

## 2018-05-04 ENCOUNTER — Encounter (HOSPITAL_COMMUNITY): Payer: Self-pay

## 2018-05-04 NOTE — Patient Instructions (Signed)
Kimberly Kirk  05/04/2018   Your procedure is scheduled on:  05/17/2018  Arrive at 0500 at [employer] [facility] on [address] at [facility]  and [employer]. You are invited to use the FREE valet parking or use the Visitor's parking deck.  Pick up the phone at the desk and dial [phone].  Call this number if you have problems the morning of surgery: [phone]  Remember:   Do not eat food:(After Midnight) Desps de medianoche.  Do not drink clear liquids: (After Midnight) Desps de medianoche.  Take these medicines the morning of surgery with A SIP OF WATER:  none   Do not wear jewelry, make-up or nail polish.  Do not wear lotions, powders, or perfumes. Do not wear deodorant.  Do not shave 48 hours prior to surgery.  Do not bring valuables to the hospital.  [facility] is not   responsible for any belongings or valuables brought to the hospital.  Contacts, dentures or bridgework may not be worn into surgery.  Leave suitcase in the car. After surgery it may be brought to your room.  For patients admitted to the hospital, checkout time is 11:00 AM the day of              discharge.      Please read over the following fact sheets that you were given:     Preparing for Surgery

## 2018-05-08 ENCOUNTER — Other Ambulatory Visit: Payer: Self-pay

## 2018-05-08 ENCOUNTER — Encounter (HOSPITAL_COMMUNITY): Payer: Self-pay

## 2018-05-08 ENCOUNTER — Inpatient Hospital Stay (HOSPITAL_COMMUNITY)
Admission: AD | Admit: 2018-05-08 | Discharge: 2018-05-08 | Disposition: A | Discharge status: Home / Self Care | Payer: 59 | Attending: Obstetrics & Gynecology | Admitting: Obstetrics & Gynecology

## 2018-05-08 DIAGNOSIS — Z3689 Encounter for other specified antenatal screening: Secondary | ICD-10-CM

## 2018-05-08 DIAGNOSIS — Z3A38 38 weeks gestation of pregnancy: Secondary | ICD-10-CM | POA: Diagnosis not present

## 2018-05-08 DIAGNOSIS — O479 False labor, unspecified: Secondary | ICD-10-CM | POA: Diagnosis not present

## 2018-05-08 DIAGNOSIS — O471 False labor at or after 37 completed weeks of gestation: Secondary | ICD-10-CM | POA: Diagnosis present

## 2018-05-08 NOTE — MAU Note (Signed)
I have communicated with Steward Drone, CNM and reviewed vital signs:  Vitals:   05/08/18 2003 05/08/18 2109  BP: 116/82 112/75  Pulse: (!) 114   Resp:    Temp:    SpO2:      Vaginal exam:  Dilation: 2 Effacement (%): 60 Cervical Position: Posterior Station: Ballotable Presentation: Vertex Exam by:: B. Jovee Dettinger, RN ,   Also reviewed contraction pattern and that non-stress test is reactive.  It has been documented that patient is contracting every 2-4 minutes with no cervical change over 1 hour not indicating active labor.  Patient denies any other complaints.  Based on this report provider has given order for discharge.  A discharge order and diagnosis entered by a provider.   Labor discharge instructions reviewed with patient.

## 2018-05-08 NOTE — MAU Provider Note (Signed)
S: Ms. Amaja Haverfield is a 33 y.o. G3P2002 at [redacted]w[redacted]d  who presents to MAU today for labor evaluation.     Cervical exam by RN:  Dilation: 2 Effacement (%): 60 Cervical Position: Posterior Station: Ballotable Presentation: Vertex Exam by:: B. Bowen, RN   Fetal Monitoring: Baseline: 140 Variability: moderate  Accelerations: present  Decelerations: none Contractions: 2-4/ mild by palpation   MDM Discussed patient with RN. NST reviewed.   A: [facility] at [redacted]w[redacted]d  False labor  P: Discharge home Labor precautions and kick counts included in AVS Patient to follow-up with [facility] as scheduled  Patient may return to MAU as needed or when in labor   Sharyon Cable, [place] 05/08/2018 9:09 PM

## 2018-05-08 NOTE — MAU Note (Signed)
Reports having abdominal cramping since 1700.  Was 2 cm dilated last week.   Repeat C/S scheduled for 5/13.  No LOF/VB.  + FM.  Had a cholecystectomy 1 month ago but no other complications w/ pregnancy.

## 2018-05-08 NOTE — Discharge Instructions (Signed)
Fetal Movement Counts Patient Name: ________________________________________________ Patient Due Date: ____________________ What is a fetal movement count?  A fetal movement count is the number of times that you feel your baby move during a certain amount of time. This may also be called a fetal kick count. A fetal movement count is recommended for every pregnant woman. You may be asked to start counting fetal movements as early as week 28 of your pregnancy. Pay attention to when your baby is most active. You may notice your baby's sleep and wake cycles. You may also notice things that make your baby move more. You should do a fetal movement count: When your baby is normally most active. At the same time each day. A good time to count movements is while you are resting, after having something to eat and drink. How do I count fetal movements? Find a quiet, comfortable area. Sit, or lie down on your side. Write down the date, the start time and stop time, and the number of movements that you felt between those two times. Take this information with you to your health care visits. For 2 hours, count kicks, flutters, swishes, rolls, and jabs. You should feel at least 10 movements during 2 hours. You may stop counting after you have felt 10 movements. If you do not feel 10 movements in 2 hours, have something to eat and drink. Then, keep resting and counting for 1 hour. If you feel at least 4 movements during that hour, you may stop counting. Contact a health care provider if: You feel fewer than 4 movements in 2 hours. Your baby is not moving like he or she usually does. Date: ____________ Start time: ____________ Stop time: ____________ Movements: ____________ Date: ____________ Start time: ____________ Stop time: ____________ Movements: ____________ Date: ____________ Start time: ____________ Stop time: ____________ Movements: ____________ Date: ____________ Start time: ____________ Stop time:  ____________ Movements: ____________ Date: ____________ Start time: ____________ Stop time: ____________ Movements: ____________ Date: ____________ Start time: ____________ Stop time: ____________ Movements: ____________ Date: ____________ Start time: ____________ Stop time: ____________ Movements: ____________ Date: ____________ Start time: ____________ Stop time: ____________ Movements: ____________ Date: ____________ Start time: ____________ Stop time: ____________ Movements: ____________ This information is not intended to replace advice given to you by your health care provider. Make sure you discuss any questions you have with your health care provider. Document Released: 01/20/2006 Document Revised: 08/20/2015 Document Reviewed: 01/30/2015 Elsevier Interactive Patient Education  2019 [employer] of the uterus can occur throughout pregnancy, but they are not always a sign that you are in labor. You may have practice contractions called Braxton Hicks contractions. These false labor contractions are sometimes confused with true labor. What are Deberah Pelton contractions? Braxton Hicks contractions are tightening movements that occur in the muscles of the uterus before labor. Unlike true labor contractions, these contractions do not result in opening (dilation) and thinning of the cervix. Toward the end of pregnancy (32-34 weeks), Braxton Hicks contractions can happen more often and may become stronger. These contractions are sometimes difficult to tell apart from true labor because they can be very uncomfortable. You should not feel embarrassed if you go to the hospital with false labor. Sometimes, the only way to tell if you are in true labor is for your health care provider to look for changes in the cervix. The health care provider will do a physical exam and may monitor your contractions. If you are not in true labor, the exam should  show that your  cervix is not dilating and your water has not broken. If there are no other health problems associated with your pregnancy, it is completely safe for you to be sent home with false labor. You may continue to have Braxton Hicks contractions until you go into true labor. How to tell the difference between true labor and false labor True labor  Contractions last 30-70 seconds.  Contractions become very regular.  Discomfort is usually felt in the top of the uterus, and it spreads to the lower abdomen and low back.  Contractions do not go away with walking.  Contractions usually become more intense and increase in frequency.  The cervix dilates and gets thinner. False labor  Contractions are usually shorter and not as strong as true labor contractions.  Contractions are usually irregular.  Contractions are often felt in the front of the lower abdomen and in the groin.  Contractions may go away when you walk around or change positions while lying down.  Contractions get weaker and are shorter-lasting as time goes on.  The cervix usually does not dilate or become thin. Follow these instructions at home:   Take over-the-counter and prescription medicines only as told by your health care provider.  Keep up with your usual exercises and follow other instructions from your health care provider.  Eat and drink lightly if you think you are going into labor.  If Braxton Hicks contractions are making you uncomfortable: ? Change your position from lying down or resting to walking, or change from walking to resting. ? Sit and rest in a tub of warm water. ? Drink enough fluid to keep your urine pale yellow. Dehydration may cause these contractions. ? Do slow and deep breathing several times an hour.  Keep all follow-up prenatal visits as told by your health care provider. This is important. Contact a health care provider if:  You have a fever.  You have continuous pain in your  abdomen. Get help right away if:  Your contractions become stronger, more regular, and closer together.  You have fluid leaking or gushing from your vagina.  You pass blood-tinged mucus (bloody show).  You have bleeding from your vagina.  You have low back pain that you never had before.  You feel your babys head pushing down and causing pelvic pressure.  Your baby is not moving inside you as much as it used to. Summary  Contractions that occur before labor are called Braxton Hicks contractions, false labor, or practice contractions.  Braxton Hicks contractions are usually shorter, weaker, farther apart, and less regular than true labor contractions. True labor contractions usually become progressively stronger and regular, and they become more frequent.  Manage discomfort from [facility] contractions by changing position, resting in a warm bath, drinking plenty of water, or practicing deep breathing. This information is not intended to replace advice given to you by your health care provider. Make sure you discuss any questions you have with your health care provider. Document Released: 05/06/2016 Document Revised: 10/05/2016 Document Reviewed: 05/06/2016 Elsevier Interactive Patient Education  2019 [employer].

## 2018-05-12 ENCOUNTER — Other Ambulatory Visit (HOSPITAL_COMMUNITY): Payer: 59

## 2018-05-15 ENCOUNTER — Other Ambulatory Visit (HOSPITAL_COMMUNITY)
Admission: RE | Admit: 2018-05-15 | Discharge: 2018-05-15 | Disposition: A | Discharge status: Home / Self Care | Payer: 59 | Source: Ambulatory Visit | Attending: Obstetrics and Gynecology | Admitting: Obstetrics and Gynecology

## 2018-05-15 ENCOUNTER — Other Ambulatory Visit: Payer: Self-pay

## 2018-05-15 DIAGNOSIS — Z1159 Encounter for screening for other viral diseases: Secondary | ICD-10-CM | POA: Diagnosis present

## 2018-05-15 NOTE — MAU Note (Addendum)
Pre-op COVID swab done. No symptoms. Specimen sent to lab.  Pre-op instructions and wash given to pt.

## 2018-05-16 ENCOUNTER — Encounter (HOSPITAL_COMMUNITY)
Admission: RE | Admit: 2018-05-16 | Discharge: 2018-05-16 | Disposition: A | Discharge status: Home / Self Care | Payer: 59 | Source: Ambulatory Visit

## 2018-05-16 HISTORY — DX: Unspecified abnormal cytological findings in specimens from vagina: R87.629

## 2018-05-16 LAB — NOVEL CORONAVIRUS, NAA (HOSP ORDER, SEND-OUT TO REF LAB; TAT 18-24 HRS): SARS-CoV-2, NAA: NOT DETECTED

## 2018-05-17 ENCOUNTER — Inpatient Hospital Stay (HOSPITAL_COMMUNITY): Payer: 59 | Admitting: Certified Registered Nurse Anesthetist

## 2018-05-17 ENCOUNTER — Encounter (HOSPITAL_COMMUNITY): Payer: Self-pay | Admitting: *Deleted

## 2018-05-17 ENCOUNTER — Encounter (HOSPITAL_COMMUNITY)
Admission: RE | Disposition: A | Discharge status: Home / Self Care | Payer: Self-pay | Source: Home / Self Care | Attending: Obstetrics and Gynecology

## 2018-05-17 ENCOUNTER — Other Ambulatory Visit: Payer: Self-pay

## 2018-05-17 ENCOUNTER — Inpatient Hospital Stay (HOSPITAL_COMMUNITY)
Admission: RE | Admit: 2018-05-17 | Discharge: 2018-05-20 | DRG: 785 | Disposition: A | Discharge status: Home / Self Care | Payer: 59 | Attending: Obstetrics and Gynecology | Admitting: Obstetrics and Gynecology

## 2018-05-17 DIAGNOSIS — Z98891 History of uterine scar from previous surgery: Secondary | ICD-10-CM

## 2018-05-17 DIAGNOSIS — O34211 Maternal care for low transverse scar from previous cesarean delivery: Principal | ICD-10-CM | POA: Diagnosis present

## 2018-05-17 DIAGNOSIS — Z302 Encounter for sterilization: Secondary | ICD-10-CM

## 2018-05-17 DIAGNOSIS — Z3A39 39 weeks gestation of pregnancy: Secondary | ICD-10-CM | POA: Diagnosis not present

## 2018-05-17 DIAGNOSIS — Z3490 Encounter for supervision of normal pregnancy, unspecified, unspecified trimester: Secondary | ICD-10-CM

## 2018-05-17 LAB — TYPE AND SCREEN
ABO/RH(D): B NEG
Antibody Screen: NEGATIVE

## 2018-05-17 LAB — RPR: RPR Ser Ql: NONREACTIVE

## 2018-05-17 LAB — CBC
HCT: 36 % (ref 36.0–46.0)
Hemoglobin: 11 g/dL — ABNORMAL LOW (ref 12.0–15.0)
MCH: 26.5 pg (ref 26.0–34.0)
MCHC: 30.6 g/dL (ref 30.0–36.0)
MCV: 86.7 fL (ref 80.0–100.0)
Platelets: 236 10*3/uL (ref 150–400)
RBC: 4.15 MIL/uL (ref 3.87–5.11)
RDW: 14.7 % (ref 11.5–15.5)
WBC: 9.9 10*3/uL (ref 4.0–10.5)
nRBC: 0 % (ref 0.0–0.2)

## 2018-05-17 SURGERY — Surgical Case
Anesthesia: Spinal

## 2018-05-17 MED ORDER — FENTANYL CITRATE (PF) 100 MCG/2ML IJ SOLN
INTRAMUSCULAR | Status: AC
  Filled 2018-05-17: qty 2

## 2018-05-17 MED ORDER — OXYCODONE HCL 5 MG PO TABS
5.0000 mg | ORAL_TABLET | ORAL | Status: DC | PRN
  Administered 2018-05-18: 21:00:00 5 mg via ORAL
  Administered 2018-05-19: 10 mg via ORAL
  Filled 2018-05-17: qty 2
  Filled 2018-05-17: qty 1

## 2018-05-17 MED ORDER — SODIUM CHLORIDE 0.9% FLUSH: 3.0000 mL | INTRAVENOUS | Status: DC | PRN

## 2018-05-17 MED ORDER — DIPHENHYDRAMINE HCL 25 MG PO CAPS
25.0000 mg | ORAL_CAPSULE | ORAL | Status: DC | PRN
  Administered 2018-05-17: 25 mg via ORAL

## 2018-05-17 MED ORDER — MEPERIDINE HCL 25 MG/ML IJ SOLN: 6.2500 mg | INTRAMUSCULAR | Status: DC | PRN

## 2018-05-17 MED ORDER — PHENYLEPHRINE HCL-NACL 20-0.9 MG/250ML-% IV SOLN
INTRAVENOUS | Status: AC
  Filled 2018-05-17: qty 250

## 2018-05-17 MED ORDER — DIPHENHYDRAMINE HCL 50 MG/ML IJ SOLN
INTRAMUSCULAR | Status: AC
  Filled 2018-05-17: qty 1

## 2018-05-17 MED ORDER — ACETAMINOPHEN 500 MG PO TABS
1000.0000 mg | ORAL_TABLET | Freq: Four times a day (QID) | ORAL | Status: DC
  Administered 2018-05-17 – 2018-05-18 (×4): 1000 mg via ORAL
  Administered 2018-05-18: 18:00:00 500 mg via ORAL
  Administered 2018-05-18 – 2018-05-19 (×3): 1000 mg via ORAL
  Filled 2018-05-17 (×8): qty 2

## 2018-05-17 MED ORDER — OXYTOCIN 40 UNITS IN NORMAL SALINE INFUSION - SIMPLE MED
INTRAVENOUS | Status: AC
  Filled 2018-05-17: qty 1000

## 2018-05-17 MED ORDER — KETOROLAC TROMETHAMINE 30 MG/ML IJ SOLN
30.0000 mg | Freq: Four times a day (QID) | INTRAMUSCULAR | Status: AC
  Administered 2018-05-17 – 2018-05-18 (×4): 30 mg via INTRAVENOUS
  Filled 2018-05-17 (×4): qty 1

## 2018-05-17 MED ORDER — SODIUM CHLORIDE 0.9 % IV SOLN
INTRAVENOUS | Status: DC | PRN
  Administered 2018-05-17: 40 [IU] via INTRAVENOUS

## 2018-05-17 MED ORDER — MENTHOL 3 MG MT LOZG: 1.0000 | LOZENGE | OROMUCOSAL | Status: DC | PRN

## 2018-05-17 MED ORDER — ONDANSETRON HCL 4 MG/2ML IJ SOLN
INTRAMUSCULAR | Status: DC | PRN
  Administered 2018-05-17: 4 mg via INTRAVENOUS

## 2018-05-17 MED ORDER — FENTANYL CITRATE (PF) 100 MCG/2ML IJ SOLN
INTRAMUSCULAR | Status: DC | PRN
  Administered 2018-05-17: 15 ug via INTRATHECAL

## 2018-05-17 MED ORDER — NALBUPHINE HCL 10 MG/ML IJ SOLN: 5.0000 mg | INTRAMUSCULAR | Status: DC | PRN

## 2018-05-17 MED ORDER — LACTATED RINGERS IV SOLN
INTRAVENOUS | Status: DC | PRN
  Administered 2018-05-17 (×2): via INTRAVENOUS

## 2018-05-17 MED ORDER — MORPHINE SULFATE (PF) 0.5 MG/ML IJ SOLN
INTRAMUSCULAR | Status: DC | PRN
  Administered 2018-05-17: .15 mg via INTRATHECAL

## 2018-05-17 MED ORDER — SOD CITRATE-CITRIC ACID 500-334 MG/5ML PO SOLN
30.0000 mL | ORAL | Status: AC
  Administered 2018-05-17: 30 mL via ORAL

## 2018-05-17 MED ORDER — FENTANYL CITRATE (PF) 100 MCG/2ML IJ SOLN: 25.0000 ug | INTRAMUSCULAR | Status: DC | PRN

## 2018-05-17 MED ORDER — OXYCODONE HCL 5 MG PO TABS: 5.0000 mg | ORAL_TABLET | Freq: Once | ORAL | Status: AC | PRN

## 2018-05-17 MED ORDER — SCOPOLAMINE 1 MG/3DAYS TD PT72
MEDICATED_PATCH | TRANSDERMAL | Status: AC
  Filled 2018-05-17: qty 1

## 2018-05-17 MED ORDER — CEFAZOLIN SODIUM-DEXTROSE 2-4 GM/100ML-% IV SOLN
INTRAVENOUS | Status: AC
  Filled 2018-05-17: qty 100

## 2018-05-17 MED ORDER — DIBUCAINE (PERIANAL) 1 % EX OINT: 1.0000 "application " | TOPICAL_OINTMENT | CUTANEOUS | Status: DC | PRN

## 2018-05-17 MED ORDER — SIMETHICONE 80 MG PO CHEW: 80.0000 mg | CHEWABLE_TABLET | ORAL | Status: DC | PRN

## 2018-05-17 MED ORDER — BUPIVACAINE IN DEXTROSE 0.75-8.25 % IT SOLN
INTRATHECAL | Status: DC | PRN
  Administered 2018-05-17: 1.8 mL via INTRATHECAL

## 2018-05-17 MED ORDER — LACTATED RINGERS IV SOLN
INTRAVENOUS | Status: DC
  Administered 2018-05-17 (×2): via INTRAVENOUS

## 2018-05-17 MED ORDER — NALOXONE HCL 0.4 MG/ML IJ SOLN: 0.4000 mg | INTRAMUSCULAR | Status: DC | PRN

## 2018-05-17 MED ORDER — SIMETHICONE 80 MG PO CHEW
80.0000 mg | CHEWABLE_TABLET | Freq: Three times a day (TID) | ORAL | Status: DC
  Administered 2018-05-17 – 2018-05-20 (×8): 80 mg via ORAL
  Filled 2018-05-17 (×8): qty 1

## 2018-05-17 MED ORDER — DIPHENHYDRAMINE HCL 25 MG PO CAPS
25.0000 mg | ORAL_CAPSULE | Freq: Four times a day (QID) | ORAL | Status: DC | PRN
  Administered 2018-05-17: 25 mg via ORAL
  Filled 2018-05-17 (×2): qty 1

## 2018-05-17 MED ORDER — ZOLPIDEM TARTRATE 5 MG PO TABS: 5.0000 mg | ORAL_TABLET | Freq: Every evening | ORAL | Status: DC | PRN

## 2018-05-17 MED ORDER — SIMETHICONE 80 MG PO CHEW
80.0000 mg | CHEWABLE_TABLET | ORAL | Status: DC
  Administered 2018-05-17 – 2018-05-20 (×3): 80 mg via ORAL
  Filled 2018-05-17 (×3): qty 1

## 2018-05-17 MED ORDER — ONDANSETRON HCL 4 MG/2ML IJ SOLN: 4.0000 mg | Freq: Three times a day (TID) | INTRAMUSCULAR | Status: DC | PRN

## 2018-05-17 MED ORDER — SODIUM CHLORIDE 0.9 % IV SOLN
INTRAVENOUS | Status: DC | PRN
  Administered 2018-05-17: 08:00:00 via INTRAVENOUS

## 2018-05-17 MED ORDER — MORPHINE SULFATE (PF) 0.5 MG/ML IJ SOLN
INTRAMUSCULAR | Status: AC
  Filled 2018-05-17: qty 10

## 2018-05-17 MED ORDER — OXYCODONE HCL 5 MG/5ML PO SOLN
5.0000 mg | Freq: Once | ORAL | Status: AC | PRN
  Administered 2018-05-17: 5 mg via ORAL

## 2018-05-17 MED ORDER — OXYTOCIN 40 UNITS IN NORMAL SALINE INFUSION - SIMPLE MED: 2.5000 [IU]/h | INTRAVENOUS | Status: AC

## 2018-05-17 MED ORDER — COCONUT OIL OIL: 1.0000 "application " | TOPICAL_OIL | Status: DC | PRN

## 2018-05-17 MED ORDER — NALBUPHINE HCL 10 MG/ML IJ SOLN: 5.0000 mg | Freq: Once | INTRAMUSCULAR | Status: DC | PRN

## 2018-05-17 MED ORDER — DIPHENHYDRAMINE HCL 50 MG/ML IJ SOLN
12.5000 mg | INTRAMUSCULAR | Status: DC | PRN
  Administered 2018-05-17: 11:00:00 12.5 mg via INTRAVENOUS

## 2018-05-17 MED ORDER — PHENYLEPHRINE HCL-NACL 20-0.9 MG/250ML-% IV SOLN
INTRAVENOUS | Status: DC | PRN
  Administered 2018-05-17: 60 ug/min via INTRAVENOUS

## 2018-05-17 MED ORDER — PRENATAL MULTIVITAMIN CH
1.0000 | ORAL_TABLET | Freq: Every day | ORAL | Status: DC
  Administered 2018-05-18 – 2018-05-19 (×2): 1 via ORAL
  Filled 2018-05-17 (×2): qty 1

## 2018-05-17 MED ORDER — LORATADINE 10 MG PO TABS
10.0000 mg | ORAL_TABLET | Freq: Every day | ORAL | Status: DC
  Administered 2018-05-18 – 2018-05-20 (×3): 10 mg via ORAL
  Filled 2018-05-17 (×3): qty 1

## 2018-05-17 MED ORDER — LIDOCAINE HCL (PF) 1 % IJ SOLN
INTRAMUSCULAR | Status: AC
  Filled 2018-05-17: qty 5

## 2018-05-17 MED ORDER — OXYCODONE HCL 5 MG/5ML PO SOLN
ORAL | Status: AC
  Filled 2018-05-17: qty 5

## 2018-05-17 MED ORDER — CEFAZOLIN SODIUM-DEXTROSE 2-4 GM/100ML-% IV SOLN
2.0000 g | INTRAVENOUS | Status: AC
  Administered 2018-05-17: 08:00:00 2 g via INTRAVENOUS

## 2018-05-17 MED ORDER — NALOXONE HCL 4 MG/10ML IJ SOLN
1.0000 ug/kg/h | INTRAVENOUS | Status: DC | PRN
  Filled 2018-05-17: qty 5

## 2018-05-17 MED ORDER — IBUPROFEN 800 MG PO TABS
800.0000 mg | ORAL_TABLET | Freq: Four times a day (QID) | ORAL | Status: DC
  Administered 2018-05-18 – 2018-05-20 (×8): 800 mg via ORAL
  Filled 2018-05-17 (×8): qty 1

## 2018-05-17 MED ORDER — SENNOSIDES-DOCUSATE SODIUM 8.6-50 MG PO TABS
2.0000 | ORAL_TABLET | ORAL | Status: DC
  Administered 2018-05-17 – 2018-05-20 (×3): 2 via ORAL
  Filled 2018-05-17 (×3): qty 2

## 2018-05-17 MED ORDER — ONDANSETRON HCL 4 MG/2ML IJ SOLN: 4.0000 mg | Freq: Once | INTRAMUSCULAR | Status: DC | PRN

## 2018-05-17 MED ORDER — WITCH HAZEL-GLYCERIN EX PADS: 1.0000 "application " | MEDICATED_PAD | CUTANEOUS | Status: DC | PRN

## 2018-05-17 MED ORDER — SOD CITRATE-CITRIC ACID 500-334 MG/5ML PO SOLN
ORAL | Status: AC
  Filled 2018-05-17: qty 30

## 2018-05-17 MED ORDER — SCOPOLAMINE 1 MG/3DAYS TD PT72
1.0000 | MEDICATED_PATCH | Freq: Once | TRANSDERMAL | Status: AC
  Administered 2018-05-17: 09:00:00 1.5 mg via TRANSDERMAL

## 2018-05-17 MED ORDER — PHENYLEPHRINE 40 MCG/ML (10ML) SYRINGE FOR IV PUSH (FOR BLOOD PRESSURE SUPPORT)
PREFILLED_SYRINGE | INTRAVENOUS | Status: DC | PRN
  Administered 2018-05-17: 80 ug via INTRAVENOUS

## 2018-05-17 MED ORDER — PHENYLEPHRINE HCL-NACL 20-0.9 MG/250ML-% IV SOLN
INTRAVENOUS | Status: AC
  Filled 2018-05-17: qty 750

## 2018-05-17 MED ORDER — ONDANSETRON HCL 4 MG/2ML IJ SOLN
INTRAMUSCULAR | Status: AC
  Filled 2018-05-17: qty 2

## 2018-05-17 MED ORDER — HYDROMORPHONE HCL 1 MG/ML IJ SOLN: 0.2000 mg | INTRAMUSCULAR | Status: DC | PRN

## 2018-05-17 MED ORDER — TETANUS-DIPHTH-ACELL PERTUSSIS 5-2.5-18.5 LF-MCG/0.5 IM SUSP: 0.5000 mL | Freq: Once | INTRAMUSCULAR | Status: DC

## 2018-05-17 MED ORDER — SODIUM CHLORIDE 0.9 % IR SOLN
Status: DC | PRN
  Administered 2018-05-17: 1

## 2018-05-17 SURGICAL SUPPLY — 35 items
BENZOIN TINCTURE PRP APPL 2/3 (GAUZE/BANDAGES/DRESSINGS) ×2 IMPLANT
CHLORAPREP W/TINT 26ML (MISCELLANEOUS) ×2 IMPLANT
CLAMP CORD UMBIL (MISCELLANEOUS) IMPLANT
CLOTH BEACON ORANGE TIMEOUT ST (SAFETY) ×2 IMPLANT
DERMABOND ADVANCED (GAUZE/BANDAGES/DRESSINGS) ×1
DERMABOND ADVANCED .7 DNX12 (GAUZE/BANDAGES/DRESSINGS) ×1 IMPLANT
DRSG OPSITE POSTOP 4X10 (GAUZE/BANDAGES/DRESSINGS) ×2 IMPLANT
ELECT REM PT RETURN 9FT ADLT (ELECTROSURGICAL) ×2
ELECTRODE REM PT RTRN 9FT ADLT (ELECTROSURGICAL) ×1 IMPLANT
EXTRACTOR VACUUM KIWI (MISCELLANEOUS) IMPLANT
GLOVE BIO SURGEON STRL SZ 6.5 (GLOVE) ×2 IMPLANT
GLOVE BIOGEL PI IND STRL 6.5 (GLOVE) ×1 IMPLANT
GLOVE BIOGEL PI IND STRL 7.0 (GLOVE) ×2 IMPLANT
GLOVE BIOGEL PI INDICATOR 6.5 (GLOVE) ×1
GLOVE BIOGEL PI INDICATOR 7.0 (GLOVE) ×2
GOWN STRL REUS W/TWL LRG LVL3 (GOWN DISPOSABLE) ×4 IMPLANT
KIT ABG SYR 3ML LUER SLIP (SYRINGE) ×2 IMPLANT
NEEDLE HYPO 25X5/8 SAFETYGLIDE (NEEDLE) ×2 IMPLANT
NS IRRIG 1000ML POUR BTL (IV SOLUTION) ×2 IMPLANT
PACK C SECTION WH (CUSTOM PROCEDURE TRAY) ×2 IMPLANT
PAD OB MATERNITY 4.3X12.25 (PERSONAL CARE ITEMS) ×2 IMPLANT
PENCIL SMOKE EVAC W/HOLSTER (ELECTROSURGICAL) ×2 IMPLANT
RETRACTOR WND ALEXIS 25 LRG (MISCELLANEOUS) IMPLANT
RTRCTR WOUND ALEXIS 25CM LRG (MISCELLANEOUS)
STRIP CLOSURE SKIN 1/2X4 (GAUZE/BANDAGES/DRESSINGS) ×2 IMPLANT
SUT PLAIN 0 NONE (SUTURE) IMPLANT
SUT PLAIN 2 0 (SUTURE) ×1
SUT PLAIN ABS 2-0 CT1 27XMFL (SUTURE) ×1 IMPLANT
SUT VIC AB 0 CT1 36 (SUTURE) ×2 IMPLANT
SUT VIC AB 0 CTX 36 (SUTURE) ×2
SUT VIC AB 0 CTX36XBRD ANBCTRL (SUTURE) ×2 IMPLANT
SUT VIC AB 4-0 PS2 27 (SUTURE) ×2 IMPLANT
TOWEL OR 17X24 6PK STRL BLUE (TOWEL DISPOSABLE) ×2 IMPLANT
TRAY FOLEY W/BAG SLVR 14FR LF (SET/KITS/TRAYS/PACK) IMPLANT
WATER STERILE IRR 1000ML POUR (IV SOLUTION) ×2 IMPLANT

## 2018-05-17 NOTE — Anesthesia Postprocedure Evaluation (Signed)
Anesthesia Post Note  Patient: Kimberly Kirk  Procedure(s) Performed: CESAREAN SECTION WITH BILATERAL TUBAL LIGATION (N/A )     Patient location during evaluation: PACU Anesthesia Type: Spinal Level of consciousness: oriented and awake and alert Pain management: pain level controlled Vital Signs Assessment: post-procedure vital signs reviewed and stable Respiratory status: spontaneous breathing, respiratory function stable and nonlabored ventilation Cardiovascular status: blood pressure returned to baseline and stable Postop Assessment: no headache, no backache, no apparent nausea or vomiting and spinal receding Anesthetic complications: no    Last Vitals:  Vitals:   05/17/18 1330 05/17/18 1430  BP: 107/75 106/74  Pulse: 73 68  Resp: 18 18  Temp: 36.7 C 36.8 C  SpO2: 99% 97%    Last Pain:  Vitals:   05/17/18 1430  TempSrc: Oral  PainSc:    Pain Goal:                   Lucretia Kern

## 2018-05-17 NOTE — Consult Note (Signed)
[facility]  Delivery Note:  C-section       05/17/2018  6:21 AM  I was called to the operating room at the request of the patient's obstetrician (Dr. Elon Spanner) for a repeat c-section.  PRENATAL HX:  This is a 33 y/o G3P2002 at 22 and 6/[redacted] weeks gestation who was admitted for a scheduled repeat c-section.  Her pregnancy has been complicated by hyperemesis which has resolved and Rh- status for which she received RhoGam.  She also underwent an uncomplicated laparoscopic cholecystectomy at 33 weeks during this pregnancy.  AROM at delivery  DELIVERY:  Infant was vigorous at delivery, initially requiring no resuscitation other than standard warming, drying and stimulation.  APGARs 7 and 8.  Central cyanosis noted, so a pulse oximeter was applied.  O2 saturations in 60s at 4 minutes, blow by o2 initiated.  Unable to wean oxygen less than 60% so CPAP applied.  By 15 minutes of age, CPAP +5, 50% required to maintain O2 saturations in low 90s.  LGA female, otherwise exam within normal limits.  Will admit to NICU as transition patient for continued need for supplemental oxygen.    _____________________ Electronically Signed By: Maryan Char, MD Neonatologist

## 2018-05-17 NOTE — Anesthesia Procedure Notes (Signed)
Spinal  Patient location during procedure: OR Staffing Anesthesiologist: Calloway Andrus E, MD Performed: anesthesiologist  Preanesthetic Checklist Completed: patient identified, surgical consent, pre-op evaluation, timeout performed, IV checked, risks and benefits discussed and monitors and equipment checked Spinal Block Patient position: sitting Prep: site prepped and draped and DuraPrep Patient monitoring: continuous pulse ox, blood pressure and heart rate Approach: midline Location: L3-4 Injection technique: single-shot Needle Needle type: Pencan  Needle gauge: 24 G Needle length: 9 cm Additional Notes Functioning IV was confirmed and monitors were applied. Sterile prep and drape, including hand hygiene and sterile gloves were used. The patient was positioned and the spine was prepped. The skin was anesthetized with lidocaine.  Free flow of clear CSF was obtained prior to injecting local anesthetic into the CSF. The needle was carefully withdrawn. The patient tolerated the procedure well.      

## 2018-05-17 NOTE — Anesthesia Preprocedure Evaluation (Signed)
Anesthesia Evaluation  Patient identified by MRN, date of birth, ID band Patient awake    Reviewed: Allergy & Precautions, H&P , NPO status , Patient's Chart, lab work & pertinent test results  History of Anesthesia Complications Negative for: history of anesthetic complications  Airway Mallampati: II  TM Distance: >3 FB Neck ROM: full    Dental no notable dental hx.    Pulmonary neg pulmonary ROS,    Pulmonary exam normal        Cardiovascular negative cardio ROS Normal cardiovascular exam     Neuro/Psych negative neurological ROS  negative psych ROS   GI/Hepatic negative GI ROS, Neg liver ROS,   Endo/Other  negative endocrine ROS  Renal/GU negative Renal ROS  negative genitourinary   Musculoskeletal   Abdominal   Peds  Hematology negative hematology ROS (+)   Anesthesia Other Findings COVID negative 5/11  Reproductive/Obstetrics (+) Pregnancy                             Anesthesia Physical Anesthesia Plan  ASA: II  Anesthesia Plan: Spinal   Post-op Pain Management:    Induction:   PONV Risk Score and Plan: Ondansetron and Treatment may vary due to age or medical condition  Airway Management Planned:   Additional Equipment:   Intra-op Plan:   Post-operative Plan:   Informed Consent: I have reviewed the patients History and Physical, chart, labs and discussed the procedure including the risks, benefits and alternatives for the proposed anesthesia with the patient or authorized representative who has indicated his/her understanding and acceptance.       Plan Discussed with:   Anesthesia Plan Comments:         Anesthesia Quick Evaluation

## 2018-05-17 NOTE — Op Note (Addendum)
PROCEDURE DATE: 05/17/18  PREOPERATIVE DIAGNOSIS: Intrauterine pregnancy at 7439 wga, Indication: repeat cS with BTL  POSTOPERATIVE DIAGNOSIS:The same  PROCEDURE: Repeat Low TransverseCesarean Section x 2 with BTL  SURGEON: Dr. Belva AgeeElise Rhylei Mcquaig  INDICATIONS:This is a 14XW [id number] G3P2002 at ~ 4339 wga wga requiring cesarean section secondary to desires repeat with BTL. Decision made to proceed with LTCS and BTL.The risks of cesarean section discussed with the patient included but were not limited to: bleeding which may require transfusion or reoperation; infection which may require antibiotics; injury to bowel, bladder, ureters or other surrounding organs; injury to the fetus; need for additional procedures including hysterectomy in the event of a life-threatening hemorrhage; placental abnormalities wth subsequent pregnancies, incisional problems, thromboembolic phenomenon and other postoperative/anesthesia complications. We also discussed bilateral tubal ligation in detail. The alternatives to permanent sterilization were reviewed and it was discussed that this is considered permanent. We reviewed the risks in detail including regret, failure and ectopic pregnancy. She understands and agrees to proceed.   The patient agreed with the proposed plan, giving informed consent for the procedure.   FINDINGS: Viable femaleinfant in vertex presentation,APGARs pending, Weight pending, Amniotic fluid thin mec, Intact placenta, three vessel cord. Grossly normal uterus, ovaries and fallopian tubes .  ANESTHESIA: Epidural ESTIMATED BLOOD LOSS: 263cc SPECIMENS: Placenta for pathology s/s calcifications COMPLICATIONS: None immediate   PROCEDURE IN DETAIL: The patient received intravenous antibiotics (2g Ancef) and had sequential compression devices applied to her lower extremities while in the preoperative area. Shewasthen taken to the operating roomwhere epidural anesthesiawas dosed up to  surgical level andwas found to be adequate. She was then placed in a dorsal supine position with a leftward tilt,and prepped and draped in a sterile manner.A foley catheter was placed into her bladder and attached to constant gravity. After an adequate timeout was performed, aPfannenstiel skin incision was made with scalpel and carried through to the underlying layer of fascia. The fascia was incised in the midline and this incision was extended bilaterally using the Mayo scissors. Kocher clamps were applied to the superior aspect of the fascial incision and the underlying rectus muscles were dissected off bluntly. A similar process was carried out on the inferior aspect of the facial incision. The rectus muscles were separated in the midline bluntly and the peritoneum was entered bluntly. A bladder flap was created sharply and developed bluntly.Atransverse hysterotomy was made with a scalpel and extended bilaterally bluntly. The bladder blade was then removed. The infant was successfully delivered, and cord was clamped and cut and infant was handed over to awaiting neonatology team. Uterine massage was then administered and the placenta delivered intact with three-vessel cord. Cord gases were taken. The uterus was cleared of clot and debris. The hysterotomy was closed with 0 vicryl.A second imbricating suture of 0-vicryl was used to reinforce the incision and aid in hemostasis. A bilateral tubal ligation was performed via modified pomeroy in the usual fashion.  Good hemostasis was noted before and after uterus and fallopian tubes placed back into abdomen. The fascia was closed with 0-Vicryl in a running fashion with good restoration of anatomy. The subcutaneus tissue was irrigated and was reapproximated using three interrupted plain gut stitches. The skin was closed with 4-0 Vicryl in a subcuticular fashion.  Final EBL was 263cc  (all surgical site and was hemostatic at end of procedure) without  any further bleeding on exam.   It's a girl - "Austin"!!   Pt tolerated the procedure well. All sponge/lap/needle counts were correct X  2. Pt taken to recovery room in stable condition.   Belva Agee MD

## 2018-05-17 NOTE — Transfer of Care (Signed)
Immediate Anesthesia Transfer of Care Note  Patient: Kimberly Kirk  Procedure(s) Performed: CESAREAN SECTION WITH BILATERAL TUBAL LIGATION (N/A )  Patient Location: PACU  Anesthesia Type:Spinal  Level of Consciousness: awake, alert  and oriented  Airway & Oxygen Therapy: Patient Spontanous Breathing  Post-op Assessment: Report given to RN and Post -op Vital signs reviewed and stable  Post vital signs: Reviewed and stable  Last Vitals:  Vitals Value Taken Time  BP 107/72 05/17/2018  9:02 AM  Temp 36.5 C 05/17/2018  9:02 AM  Pulse 80 05/17/2018  9:09 AM  Resp 13 05/17/2018  9:09 AM  SpO2 95 % 05/17/2018  9:09 AM  Vitals shown include unvalidated device data.  Last Pain:  Vitals:   05/17/18 0902  TempSrc: Oral  PainSc: 0-No pain         Complications: No apparent anesthesia complications

## 2018-05-17 NOTE — Brief Op Note (Signed)
05/17/2018  9:21 AM  PATIENT:  Kimberly Kirk  33 y.o. female  PRE-OPERATIVE DIAGNOSIS:  previous X 2, desires sterility  POST-OPERATIVE DIAGNOSIS:  previous X 2, desires sterility  PROCEDURE:  Procedure(s): CESAREAN SECTION WITH BILATERAL TUBAL LIGATION (N/A)  SURGEON:  Surgeon(s) and Role:    * Jamontae Thwaites, Madelaine Etienne, MD - Primary  PHYSICIAN ASSISTANT:   ASSISTANTS: Heather RFNA   ANESTHESIA:   spinal  EBL:  263 mL   BLOOD ADMINISTERED:none  DRAINS: Urinary Catheter (Foley)   LOCAL MEDICATIONS USED:  NONE  SPECIMEN:  Source of Specimen:  placenta and b/l falllopian tubes  DISPOSITION OF SPECIMEN:  PATHOLOGY  COUNTS:  YES  TOURNIQUET:  * No tourniquets in log *  DICTATION: .Note written in EPIC  PLAN OF CARE: Admit to inpatient   PATIENT DISPOSITION:  PACU - hemodynamically stable.   Delay start of Pharmacological VTE agent (>24hrs) due to surgical blood loss or risk of bleeding: not applicable

## 2018-05-18 ENCOUNTER — Encounter (HOSPITAL_COMMUNITY): Payer: Self-pay

## 2018-05-18 LAB — CBC
HCT: 31 % — ABNORMAL LOW (ref 36.0–46.0)
Hemoglobin: 9.9 g/dL — ABNORMAL LOW (ref 12.0–15.0)
MCH: 26.7 pg (ref 26.0–34.0)
MCHC: 31.9 g/dL (ref 30.0–36.0)
MCV: 83.6 fL (ref 80.0–100.0)
Platelets: 196 10*3/uL (ref 150–400)
RBC: 3.71 MIL/uL — ABNORMAL LOW (ref 3.87–5.11)
RDW: 14.8 % (ref 11.5–15.5)
WBC: 8.9 10*3/uL (ref 4.0–10.5)
nRBC: 0 % (ref 0.0–0.2)

## 2018-05-18 MED ORDER — RHO D IMMUNE GLOBULIN 1500 UNIT/2ML IJ SOSY
300.0000 ug | PREFILLED_SYRINGE | Freq: Once | INTRAMUSCULAR | Status: AC
  Administered 2018-05-18: 10:00:00 300 ug via INTRAVENOUS
  Filled 2018-05-18: qty 2

## 2018-05-18 NOTE — Progress Notes (Signed)
Post Partum Day 1 Subjective: no complaints  Objective: Blood pressure 113/70, pulse 71, temperature 98.2 F (36.8 C), temperature source Oral, resp. rate 18, height 5\' 7"  (1.702 m), weight 110.2 kg, last menstrual period 08/11/2017, SpO2 98 %, unknown if currently breastfeeding.  Physical Exam:  General: alert and cooperative Lochia: appropriate Uterine Fundus: firm Incision: n/a DVT Evaluation: No evidence of DVT seen on physical exam.  Recent Labs    05/17/18 0511 05/18/18 0558  HGB 11.0* 9.9*  HCT 36.0 31.0*    Assessment/Plan: Plan for discharge tomorrow, Breastfeeding and Lactation consult   LOS: 1 day   Zelphia Cairo 05/18/2018, 8:53 AM

## 2018-05-19 LAB — RH IG WORKUP (INCLUDES ABO/RH)
ABO/RH(D): B NEG
Fetal Screen: NEGATIVE
Gestational Age(Wks): 39
Unit division: 0

## 2018-05-19 MED ORDER — OXYCODONE-ACETAMINOPHEN 5-325 MG PO TABS
1.0000 | ORAL_TABLET | ORAL | Status: DC | PRN
  Administered 2018-05-19 (×2): 1 via ORAL
  Administered 2018-05-20: 04:00:00 2 via ORAL
  Filled 2018-05-19: qty 1
  Filled 2018-05-19 (×2): qty 2

## 2018-05-19 MED ORDER — OXYCODONE-ACETAMINOPHEN 5-325 MG PO TABS
1.0000 | ORAL_TABLET | Freq: Four times a day (QID) | ORAL | Status: DC | PRN
  Administered 2018-05-19: 11:00:00 1 via ORAL
  Administered 2018-05-19: 15:00:00 2 via ORAL
  Filled 2018-05-19: qty 1
  Filled 2018-05-19: qty 2

## 2018-05-19 NOTE — Progress Notes (Signed)
Subjective: Postpartum Day 2: Cesarean Delivery Patient reports incisional pain, tolerating PO, + flatus and no problems voiding.    Objective: Vital signs in last 24 hours: Temp:  [98.5 F (36.9 C)] 98.5 F (36.9 C) (05/15 0534) Pulse Rate:  [64-70] 64 (05/15 0534) Resp:  [18] 18 (05/15 0534) BP: (102-108)/(57-75) 108/75 (05/15 0534) SpO2:  [99 %] 99 % (05/14 1308)  Physical Exam:  General: alert, cooperative, appears stated age and no distress Lochia: appropriate Uterine Fundus: firm Incision: healing well DVT Evaluation: No evidence of DVT seen on physical exam.  Recent Labs    05/17/18 0511 05/18/18 0558  HGB 11.0* 9.9*  HCT 36.0 31.0*    Assessment/Plan: Status post Cesarean section. Doing well postoperatively.  Continue current care Baby has increased Resp rate so Peds is continuing to watch her.  Turner Daniels 05/19/2018, 9:19 AM

## 2018-05-19 NOTE — Progress Notes (Signed)
Pt needed 2 Percocet tablets based upon pain level. 1 Percocet tablet given, the other was dropped on the floor then wasted with Patrica Duel, RN. 1 new Percocet tablet was pulled from Pyxis and administered at 2105. (See MAR)

## 2018-05-20 MED ORDER — IBUPROFEN 800 MG PO TABS: 800.0000 mg | ORAL_TABLET | Freq: Four times a day (QID) | ORAL | 0 refills | Status: DC

## 2018-05-20 MED ORDER — OXYCODONE-ACETAMINOPHEN 5-325 MG PO TABS: 1.0000 | ORAL_TABLET | ORAL | 0 refills | Status: DC | PRN

## 2018-05-20 NOTE — Discharge Summary (Signed)
Obstetric Discharge Summary Reason for Admission: cesarean section Prenatal Procedures: none Intrapartum Procedures: cesarean section and BTL Postpartum Procedures: none Complications-Operative and Postpartum: none Hemoglobin  Date Value Ref Range Status  05/18/2018 9.9 (L) 12.0 - 15.0 g/dL Final   HCT  Date Value Ref Range Status  05/18/2018 31.0 (L) 36.0 - 46.0 % Final    Physical Exam:  General: alert, cooperative, appears stated age and no distress Lochia: appropriate Uterine Fundus: firm Incision: healing well DVT Evaluation: No evidence of DVT seen on physical exam.  Discharge Diagnoses: Term Pregnancy-delivered  Discharge Information: Date: 05/20/2018 Activity: pelvic rest Diet: routine Medications: Ibuprofen and Percocet Condition: stable Instructions: refer to practice specific booklet Discharge to: home   Newborn Data: Live born female  Birth Weight: 10 lb 10.4 oz (4830 g) APGAR: 7, 8  Newborn Delivery   Birth date/time:  05/[birth year]/2018 08:18:00 Delivery type:  C-Section, Low Transverse Trial of labor:  No C-section categorization:  Repeat     Home with mother.  Kimberly Kirk 05/20/2018, 9:36 AM

## 2019-12-26 IMAGING — US ULTRASOUND ABDOMEN LIMITED
1 series · 15 of 25 positions shown · non-contrast
Comparison: None.

CLINICAL DATA: RIGHT upper quadrant pain. Third trimester
pregnancy.

EXAM:
ULTRASOUND ABDOMEN LIMITED RIGHT UPPER QUADRANT

[Series 1: ultrasound abdomen limited · 15 of 26 slices shown]
[im 1/26]
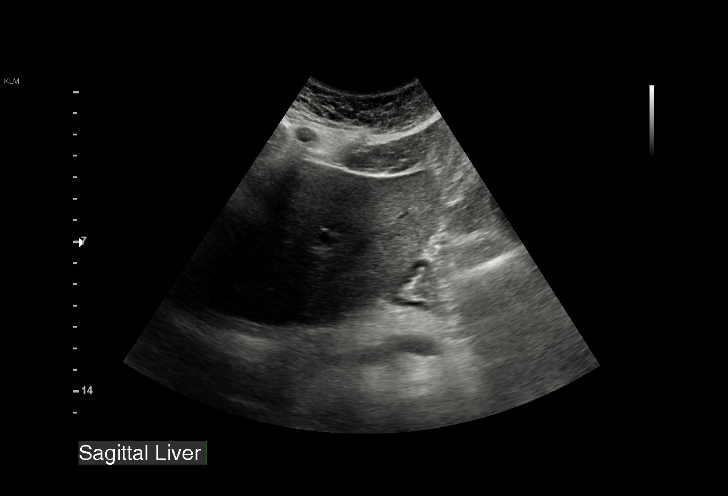
[im 3/26]
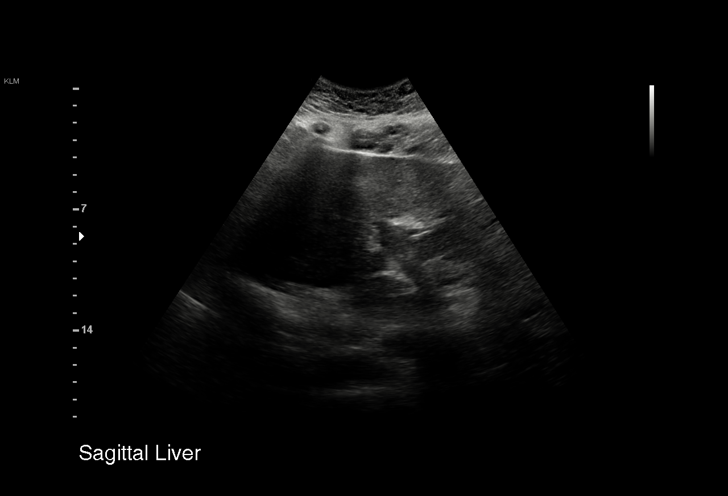
[im 5/26]
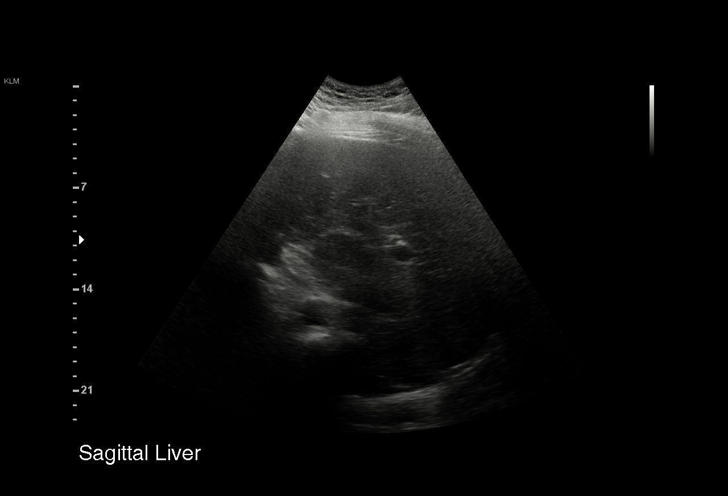
[im 6/26]
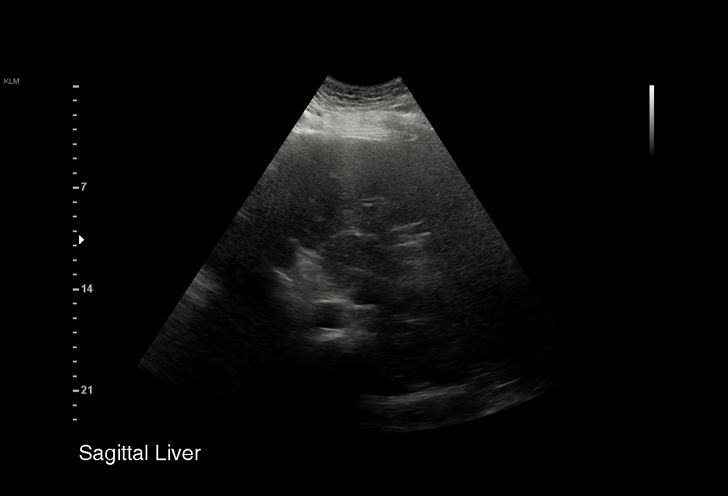
[im 8/26]
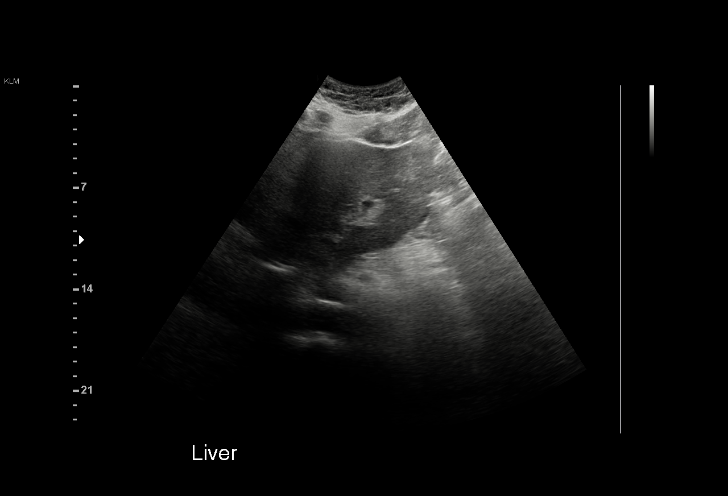
[im 10/26]
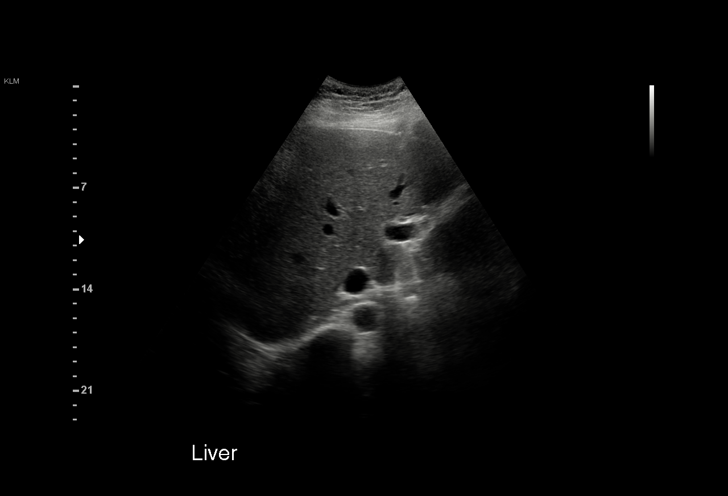
[im 11/26]
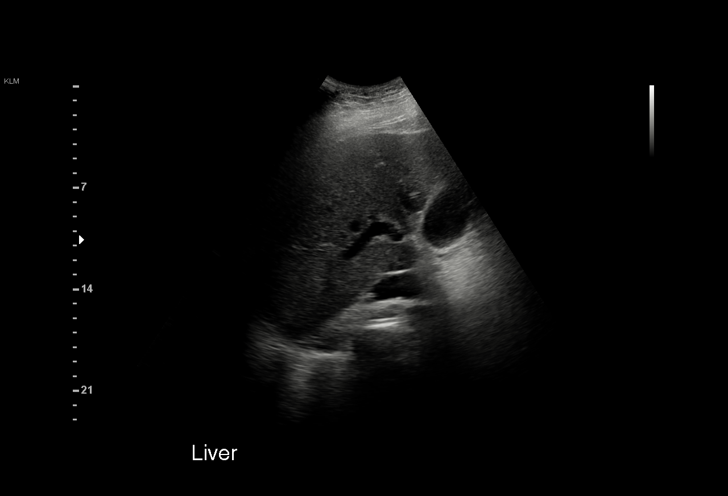
[im 13/26]
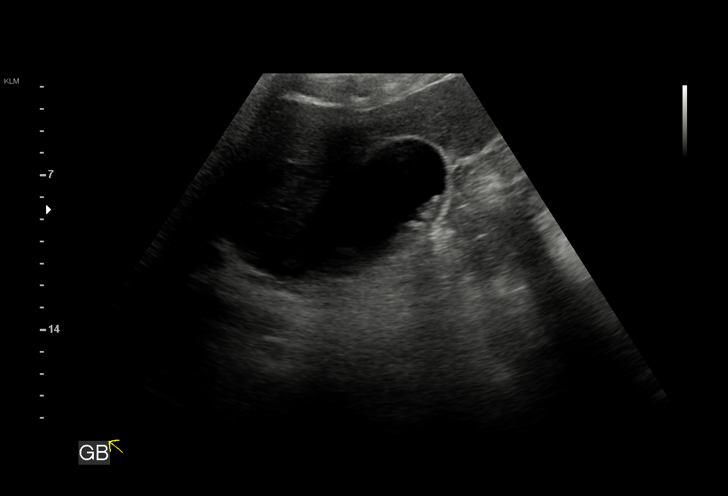
[im 15/26]
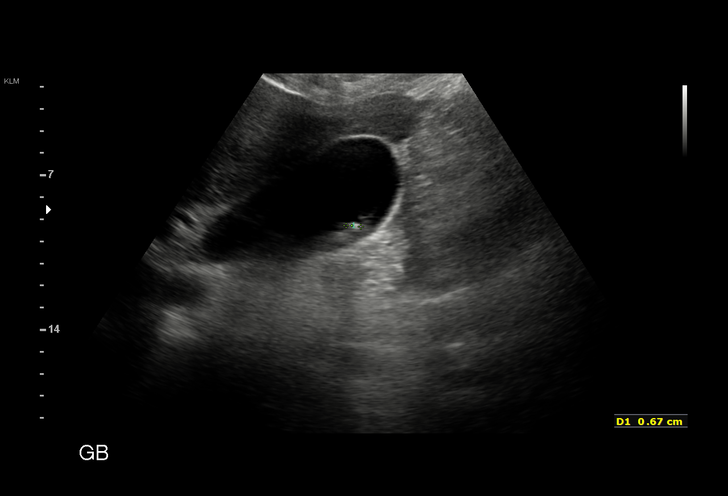
[im 16/26]
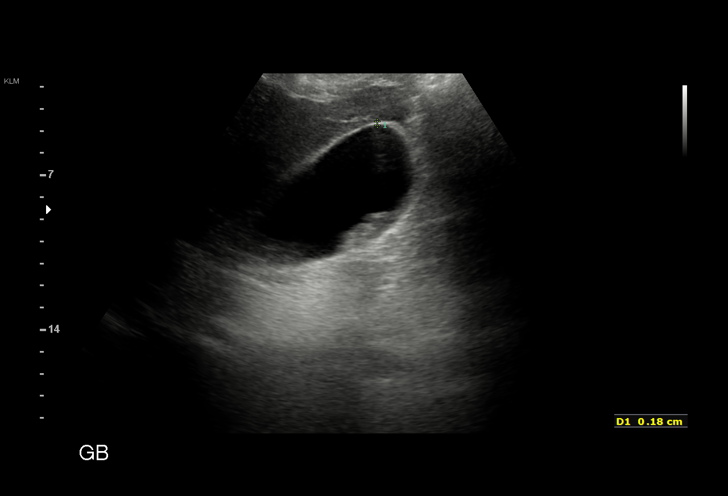
[im 18/26]
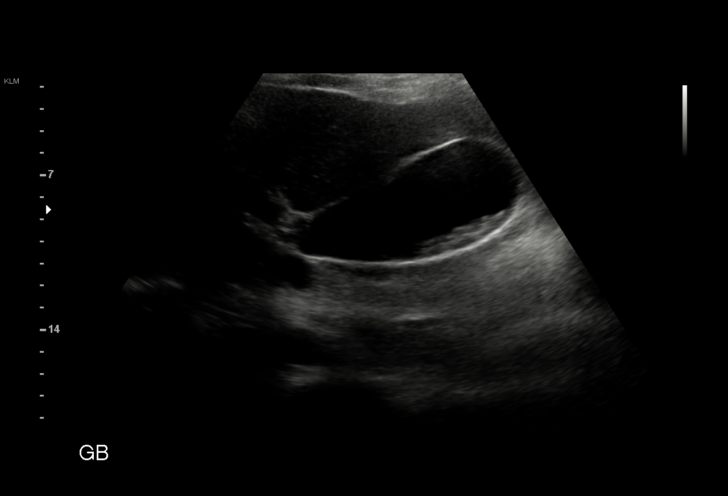
[im 20/26]
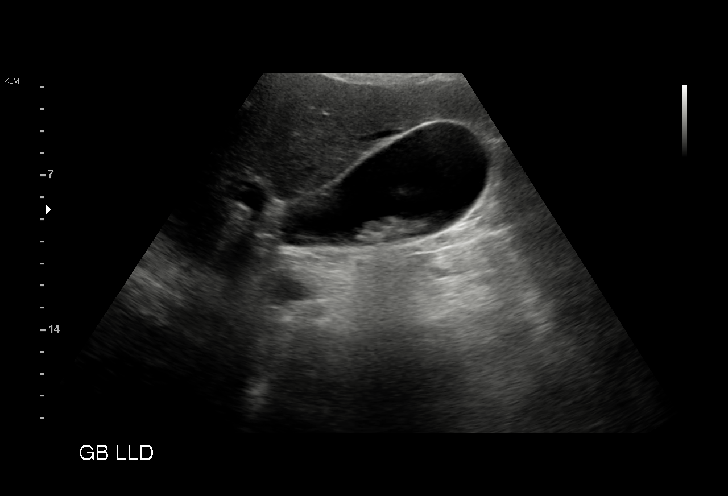
[im 21/26]
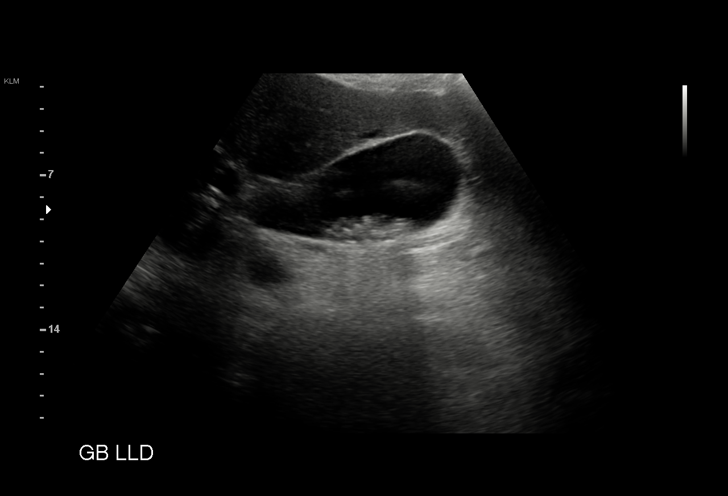
[im 23/26]
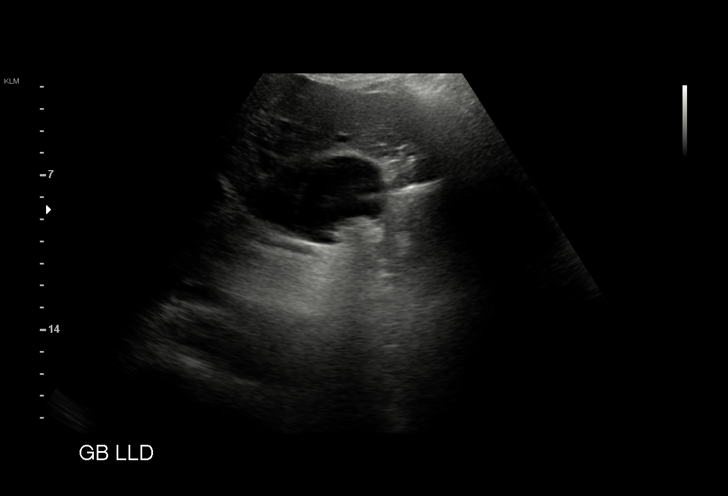
[im 26/26]
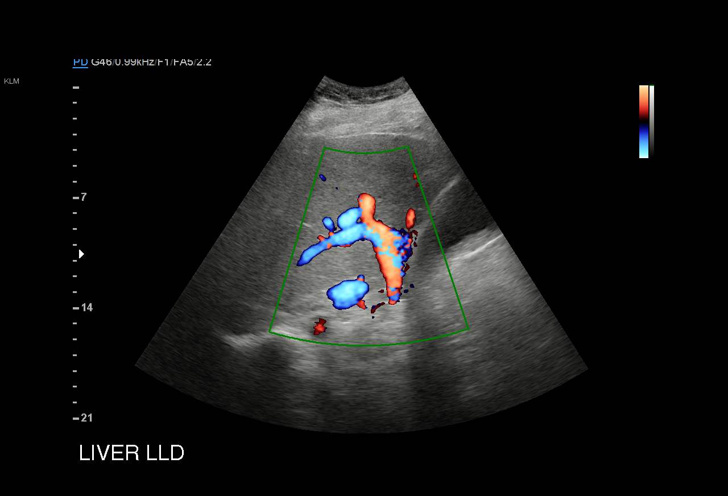

[15 of 25 positions shown; findings below may reference images not displayed]

FINDINGS: Gallbladder:

Multiple echogenic gallstones measuring to 5 mm with acoustic
shadowing. No gallbladder wall thickening or pericholecystic fluid.
However, sonographic Murphy sign was elicited.

Common bile duct:

Diameter: 4 mm.

Liver:

No focal lesion identified. Within normal limits in parenchymal
echogenicity. Portal vein is patent on color Doppler imaging with
normal direction of blood flow towards the liver.
IMPRESSION: 1. Cholelithiasis and sonographic Murphy sign suspicious for acute
cholecystitis; no corroborated findings.

## 2020-04-28 ENCOUNTER — Other Ambulatory Visit: Payer: Self-pay

## 2020-04-28 ENCOUNTER — Emergency Department
Admission: EM | Admit: 2020-04-28 | Discharge: 2020-04-28 | Disposition: A | Discharge status: Home / Self Care | Payer: 59 | Source: Home / Self Care

## 2020-04-28 DIAGNOSIS — N3001 Acute cystitis with hematuria: Secondary | ICD-10-CM

## 2020-04-28 DIAGNOSIS — R300 Dysuria: Secondary | ICD-10-CM

## 2020-04-28 LAB — POCT URINALYSIS DIP (MANUAL ENTRY)
Bilirubin, UA: NEGATIVE
Glucose, UA: NEGATIVE mg/dL
Ketones, POC UA: NEGATIVE mg/dL
Nitrite, UA: POSITIVE — AB
Protein Ur, POC: 100 mg/dL — AB
Spec Grav, UA: 1.025 (ref 1.010–1.025)
Urobilinogen, UA: 0.2 E.U./dL
pH, UA: 6 (ref 5.0–8.0)

## 2020-04-28 MED ORDER — CEPHALEXIN 500 MG PO CAPS: 500.0000 mg | ORAL_CAPSULE | Freq: Two times a day (BID) | ORAL | 0 refills | Status: AC

## 2020-04-28 NOTE — ED Provider Notes (Signed)
[name]    CSN: [id number] Arrival date & time: 04/28/20  7591      History   Chief Complaint Chief Complaint  Patient presents with  . Urinary Frequency    HPI Miaisabella Bacorn is a 35 y.o. female.   Reports dysuria since this morning, frequency since this morning.  States history of UTIs.  Thinks that she is getting UTI.  Has not attempted to treat at home.  Denies gross hematuria, chills, back pain, fever, nausea, vomiting, abdominal pain, rash, other symptoms.  ROS per HPI  The history is provided by the patient.  Urinary Frequency    Past Medical History:  Diagnosis Date  . Medical history non-contributory   . Vaginal Pap smear, abnormal    HPV    Patient Active Problem List   Diagnosis Date Noted  . Pregnant 05/17/2018  . S/P cesarean section 05/17/2018  . Cholecystitis 04/04/2018    Past Surgical History:  Procedure Laterality Date  . CESAREAN SECTION     x 2  . CESAREAN SECTION WITH BILATERAL TUBAL LIGATION N/A 05/17/2018   Procedure: CESAREAN SECTION WITH BILATERAL TUBAL LIGATION;  Surgeon: Ranae Pila, MD;  Location: [facility];  Service: Obstetrics;  Laterality: N/A;  . CHOLECYSTECTOMY N/A 04/06/2018   Procedure: LAPAROSCOPIC CHOLECYSTECTOMY;  Surgeon: Abigail Miyamoto, MD;  Location: [facility] OR;  Service: General;  Laterality: N/A;  . excision of vulvar cysts     2013  . TONSILLECTOMY    . WISDOM TOOTH EXTRACTION      OB History    Gravida  3   Para  3   Term  3   Preterm      AB      Living  3     SAB      IAB      Ectopic      Multiple  0   Live Births  3            Home Medications    Prior to Admission medications   Medication Sig Start Date End Date Taking? Authorizing Provider  busPIRone (BUSPAR) 5 MG tablet Take 5 mg by mouth 3 (three) times daily.   Yes [provider]  cephALEXin (KEFLEX) 500 MG capsule Take 1 capsule (500 mg total) by mouth 2 (two) times daily for 7 days. 04/28/20  05/05/20 Yes Moshe Cipro, NP  zolpidem (AMBIEN) 10 MG tablet Take by mouth at bedtime as needed for sleep.   Yes [provider]  acetaminophen (TYLENOL) 500 MG tablet Take 500 mg by mouth every 6 (six) hours as needed (pain).     [provider]  docusate sodium (COLACE) 100 MG capsule Take 200 mg by mouth daily.    [provider]  ibuprofen (ADVIL) 800 MG tablet Take 1 tablet (800 mg total) by mouth every 6 (six) hours. 05/20/18   Candice Camp, MD  loratadine (CLARITIN) 10 MG tablet Take 10 mg by mouth daily.    [provider]  oxyCODONE-acetaminophen (PERCOCET/ROXICET) 5-325 MG tablet Take 1-2 tablets by mouth every 4 (four) hours as needed for moderate pain. 05/20/18   Candice Camp, MD    Family History Family History  Problem Relation Age of Onset  . Healthy Mother   . Healthy Father   . Diabetes Other     Social History Social History   Tobacco Use  . Smoking status: Never Smoker  . Smokeless tobacco: Never Used  Vaping Use  .  Vaping Use: Never used  Substance Use Topics  . Alcohol use: Yes    Comment: rare  . Drug use: No     Allergies   Patient has no known allergies.   Review of Systems Review of Systems  Genitourinary: Positive for frequency.     Physical Exam Triage Vital Signs ED Triage Vitals  Enc Vitals Group     BP 04/28/20 0941 125/85     Pulse Rate 04/28/20 0941 87     Resp 04/28/20 0941 16     Temp 04/28/20 0941 98.5 F (36.9 C)     Temp Source 04/28/20 0941 Oral     SpO2 04/28/20 0941 99 %     Weight --      Height --      Head Circumference --      Peak Flow --      Pain Score 04/28/20 0938 2     Pain Loc --      Pain Edu? --      Excl. in GC? --    No data found.  Updated Vital Signs BP 125/85 (BP Location: Right Arm)   Pulse 87   Temp 98.5 F (36.9 C) (Oral)   Resp 16   SpO2 99%    Physical Exam Vitals and nursing note reviewed.  Constitutional:      General: She is not in acute  distress.    Appearance: She is well-developed.  HENT:     Head: Normocephalic and atraumatic.     Nose: Nose normal.     Mouth/Throat:     Mouth: Mucous membranes are moist.     Pharynx: Oropharynx is clear.  Eyes:     Extraocular Movements: Extraocular movements intact.     Conjunctiva/sclera: Conjunctivae normal.     Pupils: Pupils are equal, round, and reactive to light.  Cardiovascular:     Rate and Rhythm: Normal rate and regular rhythm.     Heart sounds: Normal heart sounds. No murmur heard.   Pulmonary:     Effort: Pulmonary effort is normal. No respiratory distress.     Breath sounds: Normal breath sounds.  Abdominal:     General: Bowel sounds are normal. There is no distension.     Palpations: Abdomen is soft. There is no mass.     Tenderness: There is no abdominal tenderness. There is no right CVA tenderness, left CVA tenderness, guarding or rebound.     Hernia: No hernia is present.  Musculoskeletal:        General: Normal range of motion.     Cervical back: Normal range of motion and neck supple.  Skin:    General: Skin is warm and dry.     Capillary Refill: Capillary refill takes less than 2 seconds.  Neurological:     General: No focal deficit present.     Mental Status: She is alert and oriented to person, place, and time.  Psychiatric:        Mood and Affect: Mood normal.        Behavior: Behavior normal.        Thought Content: Thought content normal.      UC Treatments / Results  Labs (all labs ordered are listed, but only abnormal results are displayed) Labs Reviewed  POCT URINALYSIS DIP (MANUAL ENTRY) - Abnormal; Notable for the following components:      Result Value   Color, UA straw (*)    Clarity, UA turbid (*)  Blood, UA large (*)    Protein Ur, POC =100 (*)    Nitrite, UA Positive (*)    Leukocytes, UA Small (1+) (*)    All other components within normal limits  URINE CULTURE    EKG   Radiology No results  found.  Procedures Procedures (including critical care time)  Medications Ordered in UC Medications - No data to display  Initial Impression / Assessment and Plan / UC Course  I have reviewed the triage vital signs and the nursing notes.  Pertinent labs & imaging results that were available during my care of the patient were reviewed by me and considered in my medical decision making (see chart for details).    Cute cystitis with hematuria Dysuria Urinary frequency  UA positive for nitrites, concerning for infection Prescribed Keflex twice daily x7 days Take Azo maximum relief over-the-counter Push fluids We will culture your urine and be in contact with any abnormal results that require further treatment Follow-up as needed  Final Clinical Impressions(s) / UC Diagnoses   Final diagnoses:  Dysuria  Acute cystitis with hematuria     Discharge Instructions     I have sent in Keflex for you to take twice a day for 7 days  We are going to culture your urine and will call you as soon as we have the results.   Drink plenty of water, 8-10 glasses per day.   You may take AZO maximum relief over the counter for painful urination.  Follow up with your primary care provider as needed.   Go to the Emergency Department if you experience severe pain, shortness of breath, high fever, or other concerns.  ]    ED Prescriptions    Medication Sig Dispense Auth. Provider   cephALEXin (KEFLEX) 500 MG capsule Take 1 capsule (500 mg total) by mouth 2 (two) times daily for 7 days. 14 capsule Moshe Cipro, NP     PDMP not reviewed this encounter.   Moshe Cipro, NP 04/28/20 [phone]

## 2020-04-28 NOTE — ED Triage Notes (Signed)
Patient presents to Urgent Care with complaints of urinary frequency since this morning. Patient reports she thinks she has a uti.

## 2020-04-28 NOTE — Discharge Instructions (Addendum)
I have sent in Keflex for you to take twice a day for 7 days  We are going to culture your urine and will call you as soon as we have the results.   Drink plenty of water, 8-10 glasses per day.   You may take AZO maximum relief over the counter for painful urination.  Follow up with your primary care provider as needed.   Go to the Emergency Department if you experience severe pain, shortness of breath, high fever, or other concerns.  ]

## 2020-04-30 LAB — URINE CULTURE
MICRO NUMBER:: 11809151
SPECIMEN QUALITY:: ADEQUATE

## 2020-07-09 ENCOUNTER — Other Ambulatory Visit: Payer: Self-pay

## 2020-07-09 ENCOUNTER — Emergency Department
Admission: EM | Admit: 2020-07-09 | Discharge: 2020-07-09 | Disposition: A | Discharge status: Home / Self Care | Payer: BC Managed Care – PPO | Source: Home / Self Care

## 2020-07-09 ENCOUNTER — Encounter: Payer: Self-pay | Admitting: Emergency Medicine

## 2020-07-09 DIAGNOSIS — N3001 Acute cystitis with hematuria: Secondary | ICD-10-CM | POA: Diagnosis not present

## 2020-07-09 LAB — POCT URINALYSIS DIP (MANUAL ENTRY)
Bilirubin, UA: NEGATIVE
Glucose, UA: NEGATIVE mg/dL
Ketones, POC UA: NEGATIVE mg/dL
Nitrite, UA: POSITIVE — AB
Protein Ur, POC: NEGATIVE mg/dL
Spec Grav, UA: 1.025 (ref 1.010–1.025)
Urobilinogen, UA: 0.2 E.U./dL
pH, UA: 5.5 (ref 5.0–8.0)

## 2020-07-09 MED ORDER — NITROFURANTOIN MONOHYD MACRO 100 MG PO CAPS: 100.0000 mg | ORAL_CAPSULE | Freq: Two times a day (BID) | ORAL | 0 refills | Status: DC

## 2020-07-09 NOTE — ED Triage Notes (Signed)
Dysuria started on Monday - 07/07/20 - took AZO x 2 days  Continues to have frequency  No AZO since last night Denies fever or chills UTI w/in the last month or 2  No hot tubs COVID 01/2020 COVID vaccine 11/21

## 2020-07-09 NOTE — ED Provider Notes (Signed)
[name]    CSN: [id number] Arrival date & time: 07/09/20  1820      History   Chief Complaint Chief Complaint  Patient presents with   Urinary Urgency    HPI Kimberly Kirk is a 35 y.o. female.   HPI 35 year old female presents with dysuria for 2 days reports taking AZO for 2 days and continues to have increased urinary frequency.  Denies fever, chills, or flank pain.  Reports last UTI was on 04/28/2020, patient was evaluated here and treated with Cephalexin.  Past Medical History:  Diagnosis Date   Medical history non-contributory    Vaginal Pap smear, abnormal    HPV    Patient Active Problem List   Diagnosis Date Noted   Pregnant 05/17/2018   S/P cesarean section 05/17/2018   Cholecystitis 04/04/2018    Past Surgical History:  Procedure Laterality Date   CESAREAN SECTION     x 2   CESAREAN SECTION WITH BILATERAL TUBAL LIGATION N/A 05/17/2018   Procedure: CESAREAN SECTION WITH BILATERAL TUBAL LIGATION;  Surgeon: Ranae Pila, MD;  Location: [facility];  Service: Obstetrics;  Laterality: N/A;   CHOLECYSTECTOMY N/A 04/06/2018   Procedure: LAPAROSCOPIC CHOLECYSTECTOMY;  Surgeon: Abigail Miyamoto, MD;  Location: [facility] OR;  Service: General;  Laterality: N/A;   excision of vulvar cysts     2013   TONSILLECTOMY     WISDOM TOOTH EXTRACTION      OB History     Gravida  3   Para  3   Term  3   Preterm      AB      Living  3      SAB      IAB      Ectopic      Multiple  0   Live Births  3            Home Medications    Prior to Admission medications   Medication Sig Start Date End Date Taking? Authorizing Provider  nitrofurantoin, macrocrystal-monohydrate, (MACROBID) 100 MG capsule Take 1 capsule (100 mg total) by mouth 2 (two) times daily. 07/09/20  Yes Trevor Iha, FNP  acetaminophen (TYLENOL) 500 MG tablet Take 500 mg by mouth every 6 (six) hours as needed (pain).     [provider]  busPIRone (BUSPAR) 5 MG  tablet Take 5 mg by mouth 3 (three) times daily.    [provider]  docusate sodium (COLACE) 100 MG capsule Take 200 mg by mouth daily.    [provider]  ibuprofen (ADVIL) 800 MG tablet Take 1 tablet (800 mg total) by mouth every 6 (six) hours. Patient not taking: Reported on 07/09/2020 05/20/18   Candice Camp, MD  loratadine (CLARITIN) 10 MG tablet Take 10 mg by mouth daily.    [provider]  oxyCODONE-acetaminophen (PERCOCET/ROXICET) 5-325 MG tablet Take 1-2 tablets by mouth every 4 (four) hours as needed for moderate pain. Patient not taking: Reported on 07/09/2020 05/20/18   Candice Camp, MD  zolpidem (AMBIEN) 10 MG tablet Take by mouth at bedtime as needed for sleep.    [provider]    Family History Family History  Problem Relation Age of Onset   Healthy Mother    Healthy Father    Diabetes Other     Social History Social History   Tobacco Use   Smoking status: Never   Smokeless tobacco: Never  Vaping Use   Vaping Use: Never used  Substance Use  Topics   Alcohol use: Yes    Alcohol/week: 2.0 standard drinks    Types: 2 Standard drinks or equivalent per week   Drug use: No     Allergies   Patient has no known allergies.   Review of Systems Review of Systems  Genitourinary:  Positive for dysuria and frequency.  All other systems reviewed and are negative.   Physical Exam Triage Vital Signs ED Triage Vitals  Enc Vitals Group     BP 07/09/20 1829 103/71     Pulse Rate 07/09/20 1829 75     Resp 07/09/20 1829 15     Temp 07/09/20 1829 97.8 F (36.6 C)     Temp Source 07/09/20 1829 Oral     SpO2 07/09/20 1829 97 %     Weight --      Height --      Head Circumference --      Peak Flow --      Pain Score 07/09/20 1833 3     Pain Loc --      Pain Edu? --      Excl. in GC? --    No data found.  Updated Vital Signs BP 103/71 (BP Location: Right Arm)   Pulse 75   Temp 97.8 F (36.6 C) (Oral)   Resp 15   SpO2 97%    Breastfeeding No      Physical Exam Vitals and nursing note reviewed.  Constitutional:      General: She is not in acute distress.    Appearance: Normal appearance. She is obese. She is not ill-appearing.  HENT:     Head: Normocephalic and atraumatic.     Mouth/Throat:     Mouth: Mucous membranes are moist.     Pharynx: Oropharynx is clear.  Eyes:     Extraocular Movements: Extraocular movements intact.     Conjunctiva/sclera: Conjunctivae normal.     Pupils: Pupils are equal, round, and reactive to light.  Cardiovascular:     Rate and Rhythm: Normal rate and regular rhythm.     Pulses: Normal pulses.     Heart sounds: Normal heart sounds.  Pulmonary:     Effort: Pulmonary effort is normal. No respiratory distress.     Breath sounds: Normal breath sounds. No stridor. No wheezing, rhonchi or rales.  Abdominal:     Tenderness: There is no right CVA tenderness or left CVA tenderness.  Musculoskeletal:        General: Normal range of motion.     Cervical back: Normal range of motion and neck supple. No tenderness.  Lymphadenopathy:     Cervical: No cervical adenopathy.  Skin:    General: Skin is warm and dry.  Neurological:     General: No focal deficit present.     Mental Status: She is alert and oriented to person, place, and time.  Psychiatric:        Mood and Affect: Mood normal.        Behavior: Behavior normal.     UC Treatments / Results  Labs (all labs ordered are listed, but only abnormal results are displayed) Labs Reviewed  POCT URINALYSIS DIP (MANUAL ENTRY) - Abnormal; Notable for the following components:      Result Value   Blood, UA trace-lysed (*)    Nitrite, UA Positive (*)    Leukocytes, UA Trace (*)    All other components within normal limits  URINE CULTURE    EKG   Radiology No  results found.  Procedures Procedures (including critical care time)  Medications Ordered in UC Medications - No data to display  Initial Impression /  Assessment and Plan / UC Course  I have reviewed the triage vital signs and the nursing notes.  Pertinent labs & imaging results that were available during my care of the patient were reviewed by me and considered in my medical decision making (see chart for details).     MDM: 1.  Acute cystitis with hematuria-urine culture ordered Rx'd Macrobid. Patient discharged home, hemodynamically stable. Final Clinical Impressions(s) / UC Diagnoses   Final diagnoses:  Acute cystitis with hematuria     Discharge Instructions      Advised patient to take medication as directed with food to completion.  Encourage patient to increase daily water intake while taking this medication.  Advised we will follow-up with urine culture results once received.     ED Prescriptions     Medication Sig Dispense Auth. Provider   nitrofurantoin, macrocrystal-monohydrate, (MACROBID) 100 MG capsule Take 1 capsule (100 mg total) by mouth 2 (two) times daily. 10 capsule Trevor Iha, FNP      PDMP not reviewed this encounter.   Trevor Iha, FNP 07/09/20 [phone]

## 2020-07-09 NOTE — Discharge Instructions (Addendum)
Advised patient to take medication as directed with food to completion.  Encourage patient to increase daily water intake while taking this medication.  Advised we will follow-up with urine culture results once received.

## 2020-07-11 LAB — URINE CULTURE
MICRO NUMBER:: 12089063
SPECIMEN QUALITY:: ADEQUATE

## 2020-12-24 ENCOUNTER — Emergency Department
Admission: EM | Admit: 2020-12-24 | Discharge: 2020-12-24 | Disposition: A | Discharge status: Home / Self Care | Payer: BC Managed Care – PPO | Source: Home / Self Care

## 2020-12-24 ENCOUNTER — Other Ambulatory Visit: Payer: Self-pay

## 2020-12-24 DIAGNOSIS — B349 Viral infection, unspecified: Secondary | ICD-10-CM

## 2020-12-24 DIAGNOSIS — R52 Pain, unspecified: Secondary | ICD-10-CM | POA: Diagnosis not present

## 2020-12-24 LAB — POC SARS CORONAVIRUS 2 AG -  ED: SARS Coronavirus 2 Ag: NEGATIVE

## 2020-12-24 LAB — POC INFLUENZA A AND B ANTIGEN (URGENT CARE ONLY)
Influenza A Ag: NEGATIVE
Influenza B Ag: NEGATIVE

## 2020-12-24 NOTE — ED Triage Notes (Signed)
Pt presents with cough, sore throat and body aches x2 days. Pt denies any fever

## 2020-12-24 NOTE — Discharge Instructions (Addendum)
Advised patient may alternate between Tylenol 1000 mg 1-2 times daily and Ibuprofen 600 mg 1-2 times daily, as needed for fever and myalgias.  Encouraged patient to increase daily water intake while taking these medications.

## 2020-12-24 NOTE — ED Provider Notes (Signed)
[name]    CSN: [id number] Arrival date & time: 12/24/20  0801      History   Chief Complaint Chief Complaint  Patient presents with   Cough   Sore Throat   Otalgia   Generalized Body Aches    HPI Kimberly Kirk is a 35 y.o. female.   HPI 35 year old female presents with cough, sore throat and body aches for 2 days.  Past Medical History:  Diagnosis Date   Medical history non-contributory    Vaginal Pap smear, abnormal    HPV    Patient Active Problem List   Diagnosis Date Noted   Pregnant 05/17/2018   S/P cesarean section 05/17/2018   Cholecystitis 04/04/2018    Past Surgical History:  Procedure Laterality Date   CESAREAN SECTION     x 2   CESAREAN SECTION WITH BILATERAL TUBAL LIGATION N/A 05/17/2018   Procedure: CESAREAN SECTION WITH BILATERAL TUBAL LIGATION;  Surgeon: Ranae Pila, MD;  Location: [facility];  Service: Obstetrics;  Laterality: N/A;   CHOLECYSTECTOMY N/A 04/06/2018   Procedure: LAPAROSCOPIC CHOLECYSTECTOMY;  Surgeon: Abigail Miyamoto, MD;  Location: [facility];  Service: General;  Laterality: N/A;   excision of vulvar cysts     2013   TONSILLECTOMY     WISDOM TOOTH EXTRACTION      OB History     Gravida  3   Para  3   Term  3   Preterm      AB      Living  3      SAB      IAB      Ectopic      Multiple  0   Live Births  3            Home Medications    Prior to Admission medications   Medication Sig Start Date End Date Taking? Authorizing Provider  acetaminophen (TYLENOL) 500 MG tablet Take 500 mg by mouth every 6 (six) hours as needed (pain).     [provider]  busPIRone (BUSPAR) 5 MG tablet Take 5 mg by mouth 3 (three) times daily.    [provider]  docusate sodium (COLACE) 100 MG capsule Take 200 mg by mouth daily. Patient not taking: Reported on 12/24/2020    [provider]  ibuprofen (ADVIL) 800 MG tablet Take 1 tablet (800 mg total) by mouth every 6  (six) hours. Patient not taking: Reported on 07/09/2020 05/20/18   Candice Camp, MD  loratadine (CLARITIN) 10 MG tablet Take 10 mg by mouth daily.    [provider]  nitrofurantoin, macrocrystal-monohydrate, (MACROBID) 100 MG capsule Take 1 capsule (100 mg total) by mouth 2 (two) times daily. Patient not taking: Reported on 12/24/2020 07/09/20   Trevor Iha, FNP  oxyCODONE-acetaminophen (PERCOCET/ROXICET) 5-325 MG tablet Take 1-2 tablets by mouth every 4 (four) hours as needed for moderate pain. Patient not taking: Reported on 07/09/2020 05/20/18   Candice Camp, MD  zolpidem (AMBIEN) 10 MG tablet Take by mouth at bedtime as needed for sleep.    [provider]    Family History Family History  Problem Relation Age of Onset   Healthy Mother    Healthy Father    Diabetes Other     Social History Social History   Tobacco Use   Smoking status: Never   Smokeless tobacco: Never  Vaping Use   Vaping Use: Never used  Substance Use Topics   Alcohol use: Yes  Alcohol/week: 2.0 standard drinks    Types: 2 Standard drinks or equivalent per week   Drug use: No     Allergies   Patient has no known allergies.   Review of Systems Review of Systems  Constitutional:  Positive for fever.  HENT:  Positive for congestion and sore throat.   Respiratory:  Positive for cough.   Musculoskeletal:  Positive for myalgias.  All other systems reviewed and are negative.   Physical Exam Triage Vital Signs ED Triage Vitals  Enc Vitals Group     BP 12/24/20 0806 (!) 129/92     Pulse Rate 12/24/20 0806 (!) 111     Resp 12/24/20 0806 14     Temp 12/24/20 0806 99.4 F (37.4 C)     Temp Source 12/24/20 0806 Oral     SpO2 12/24/20 0806 98 %     Weight --      Height --      Head Circumference --      Peak Flow --      Pain Score 12/24/20 0808 0     Pain Loc --      Pain Edu? --      Excl. in [place]? --    No data found.  Updated Vital Signs BP (!) 129/92 (BP Location: Right  Arm)    Pulse (!) 111    Temp 99.4 F (37.4 C) (Oral)    Resp 14    SpO2 98%      Physical Exam Vitals and nursing note reviewed.  Constitutional:      General: She is not in acute distress.    Appearance: Normal appearance. She is obese. She is not ill-appearing.  HENT:     Head: Normocephalic and atraumatic.     Right Ear: Tympanic membrane, ear canal and external ear normal.     Left Ear: Tympanic membrane, ear canal and external ear normal.     Mouth/Throat:     Mouth: Mucous membranes are moist.     Pharynx: Oropharynx is clear.  Eyes:     Extraocular Movements: Extraocular movements intact.     Conjunctiva/sclera: Conjunctivae normal.     Pupils: Pupils are equal, round, and reactive to light.  Cardiovascular:     Rate and Rhythm: Normal rate and regular rhythm.     Pulses: Normal pulses.     Heart sounds: Normal heart sounds.  Pulmonary:     Effort: Pulmonary effort is normal.     Breath sounds: Normal breath sounds.  Musculoskeletal:     Cervical back: Normal range of motion and neck supple.  Skin:    General: Skin is warm and dry.  Neurological:     General: No focal deficit present.     Mental Status: She is alert and oriented to person, place, and time. Mental status is at baseline.     UC Treatments / Results  Labs (all labs ordered are listed, but only abnormal results are displayed) Labs Reviewed  POC SARS CORONAVIRUS 2 AG -  ED  POC INFLUENZA A AND B ANTIGEN (URGENT CARE ONLY)    EKG   Radiology No results found.  Procedures Procedures (including critical care time)  Medications Ordered in UC Medications - No data to display  Initial Impression / Assessment and Plan / UC Course  I have reviewed the triage vital signs and the nursing notes.  Pertinent labs & imaging results that were available during my care of the patient were reviewed  by me and considered in my medical decision making (see chart for details).     MDM: 1.  Generalized  body aches-COVID-19 and Influenza A/B negative; 2.  Viral illness-advised patient may alternate between Tylenol 1000 mg 1-2 times daily and Ibuprofen 600 mg 1-2 times daily, as needed for fever and myalgias.  Encouraged patient to increase daily water intake while taking these medications.  Discharged home, hemodynamically stable. Final Clinical Impressions(s) / UC Diagnoses   Final diagnoses:  Generalized body aches  Viral illness     Discharge Instructions      Advised patient may alternate between Tylenol 1000 mg 1-2 times daily and Ibuprofen 600 mg 1-2 times daily, as needed for fever and myalgias.  Encouraged patient to increase daily water intake while taking these medications.     ED Prescriptions   None    PDMP not reviewed this encounter.   Trevor Iha, FNP 12/24/20 [phone]

## 2021-10-07 ENCOUNTER — Encounter: Payer: Self-pay | Admitting: Emergency Medicine

## 2021-10-07 ENCOUNTER — Ambulatory Visit
Admission: EM | Admit: 2021-10-07 | Discharge: 2021-10-07 | Disposition: A | Discharge status: Home / Self Care | Payer: BC Managed Care – PPO | Attending: Family Medicine | Admitting: Family Medicine

## 2021-10-07 DIAGNOSIS — J019 Acute sinusitis, unspecified: Secondary | ICD-10-CM

## 2021-10-07 DIAGNOSIS — B9689 Other specified bacterial agents as the cause of diseases classified elsewhere: Secondary | ICD-10-CM | POA: Diagnosis not present

## 2021-10-07 MED ORDER — AMOXICILLIN 875 MG PO TABS: 875.0000 mg | ORAL_TABLET | Freq: Two times a day (BID) | ORAL | 0 refills | Status: AC

## 2021-10-07 NOTE — Discharge Instructions (Signed)
Drink lots of water  take amoxicillin 2 times a day for 10 days Use Flonase daily until symptoms improve For problems 10 you DayQuil, NyQuil, over-the-counter medications for discomfort

## 2021-10-07 NOTE — ED Provider Notes (Signed)
[facility]    CSN: [id number] Arrival date & time: 10/07/21  0911      History   Chief Complaint Chief Complaint  Patient presents with   Facial Pain    HPI Kimberly Kirk is a 36 y.o. female.   HPI  Patient states that she has been fighting a cold for over a week, and now has pressure and pain in her face.  She recognizes this is a sinus infection.  She has had them before.  She has required antibiotics to clear up from her last sinus infection.  She does not have underlying allergies.  No known exposure to COVID or illness  Past Medical History:  Diagnosis Date   Medical history non-contributory    Vaginal Pap smear, abnormal    HPV    Patient Active Problem List   Diagnosis Date Noted   Pregnant 05/17/2018   S/P cesarean section 05/17/2018   Cholecystitis 04/04/2018    Past Surgical History:  Procedure Laterality Date   CESAREAN SECTION     x 2   CESAREAN SECTION WITH BILATERAL TUBAL LIGATION N/A 05/17/2018   Procedure: CESAREAN SECTION WITH BILATERAL TUBAL LIGATION;  Surgeon: Tyson Dense, MD;  Location: [place];  Service: Obstetrics;  Laterality: N/A;   CHOLECYSTECTOMY N/A 04/06/2018   Procedure: LAPAROSCOPIC CHOLECYSTECTOMY;  Surgeon: Coralie Keens, MD;  Location: [place];  Service: General;  Laterality: N/A;   excision of vulvar cysts     2013   TONSILLECTOMY     WISDOM TOOTH EXTRACTION      OB History     Gravida  3   Para  3   Term  3   Preterm      AB      Living  3      SAB      IAB      Ectopic      Multiple  0   Live Births  3            Home Medications    Prior to Admission medications   Medication Sig Start Date End Date Taking? Authorizing Provider  amoxicillin (AMOXIL) 875 MG tablet Take 1 tablet (875 mg total) by mouth 2 (two) times daily for 10 days. 10/07/21 10/17/21 Yes Raylene Everts, MD  Eszopiclone 3 MG TABS Take by mouth. 09/14/21  Yes [provider]  acetaminophen  (TYLENOL) 500 MG tablet Take 500 mg by mouth every 6 (six) hours as needed (pain).     [provider]  busPIRone (BUSPAR) 5 MG tablet Take 5 mg by mouth 3 (three) times daily.    [provider]  loratadine (CLARITIN) 10 MG tablet Take 10 mg by mouth daily.    [provider]  zolpidem (AMBIEN) 10 MG tablet Take by mouth at bedtime as needed for sleep.    [provider]    Family History Family History  Problem Relation Age of Onset   Healthy Mother    Healthy Father    Diabetes Other     Social History Social History   Tobacco Use   Smoking status: Never   Smokeless tobacco: Never  Vaping Use   Vaping Use: Never used  Substance Use Topics   Alcohol use: Yes    Alcohol/week: 2.0 standard drinks of alcohol    Types: 2 Standard drinks or equivalent per week   Drug use: No     Allergies   Patient has no known  allergies.   Review of Systems Review of Systems  See HPI Physical Exam Triage Vital Signs ED Triage Vitals  Enc Vitals Group     BP 10/07/21 0923 116/81     Pulse Rate 10/07/21 0923 96     Resp 10/07/21 0923 16     Temp 10/07/21 0923 98.9 F (37.2 C)     Temp Source 10/07/21 0923 Oral     SpO2 10/07/21 0923 98 %     Weight 10/07/21 0924 225 lb (102.1 kg)     Height --      Head Circumference --      Peak Flow --      Pain Score 10/07/21 0923 4     Pain Loc --      Pain Edu? --      Excl. in [place]? --    No data found.  Updated Vital Signs BP 116/81 (BP Location: Right Arm)   Pulse 96   Temp 98.9 F (37.2 C) (Oral)   Resp 16   Wt 102.1 kg   SpO2 98%   BMI 35.24 kg/m       Physical Exam Constitutional:      General: She is not in acute distress.    Appearance: She is well-developed. She is ill-appearing.  HENT:     Head: Normocephalic and atraumatic.     Right Ear: Tympanic membrane and ear canal normal.     Left Ear: Tympanic membrane and ear canal normal.     Nose: Congestion present.      Mouth/Throat:     Pharynx: Posterior oropharyngeal erythema present.     Comments: Tenderness in the maxillary sinuses.  Congestion nasal membranes.  Mild erythema posterior pharynx Eyes:     Conjunctiva/sclera: Conjunctivae normal.     Pupils: Pupils are equal, round, and reactive to light.  Cardiovascular:     Rate and Rhythm: Normal rate.  Pulmonary:     Effort: Pulmonary effort is normal. No respiratory distress.  Abdominal:     General: There is no distension.     Palpations: Abdomen is soft.  Musculoskeletal:        General: Normal range of motion.     Cervical back: Normal range of motion.  Lymphadenopathy:     Cervical: No cervical adenopathy.  Skin:    General: Skin is warm and dry.  Neurological:     Mental Status: She is alert.      UC Treatments / Results  Labs (all labs ordered are listed, but only abnormal results are displayed) Labs Reviewed - No data to display  EKG   Radiology No results found.  Procedures Procedures (including critical care time)  Medications Ordered in UC Medications - No data to display  Initial Impression / Assessment and Plan / UC Course  I have reviewed the triage vital signs and the nursing notes.  Pertinent labs & imaging results that were available during my care of the patient were reviewed by me and considered in my medical decision making (see chart for details).      Final Clinical Impressions(s) / UC Diagnoses   Final diagnoses:  Acute bacterial sinusitis     Discharge Instructions      Drink lots of water  take amoxicillin 2 times a day for 10 days Use Flonase daily until symptoms improve For problems 10 you DayQuil, NyQuil, over-the-counter medications for discomfort      ED Prescriptions     Medication Sig Dispense Auth.  Provider   amoxicillin (AMOXIL) 875 MG tablet Take 1 tablet (875 mg total) by mouth 2 (two) times daily for 10 days. 20 tablet Raylene Everts, MD      PDMP not  reviewed this encounter.   Raylene Everts, MD 10/07/21 [phone]

## 2021-10-07 NOTE — ED Triage Notes (Signed)
Pt states she has had cold symptoms for over 1 week but in the last couple days she has developed facial pressure and pain. Feels like sinus infection and states she has had these before.

## 2021-10-08 ENCOUNTER — Telehealth: Payer: Self-pay

## 2021-10-08 NOTE — Telephone Encounter (Signed)
TCT pt to follow up from recent visit. Pt denied any additional needs at this time.

## 2022-03-15 ENCOUNTER — Encounter: Payer: Self-pay | Admitting: Emergency Medicine

## 2022-03-15 ENCOUNTER — Ambulatory Visit
Admission: EM | Admit: 2022-03-15 | Discharge: 2022-03-15 | Disposition: A | Discharge status: Home / Self Care | Payer: BC Managed Care – PPO | Attending: Family Medicine | Admitting: Family Medicine

## 2022-03-15 DIAGNOSIS — J069 Acute upper respiratory infection, unspecified: Secondary | ICD-10-CM

## 2022-03-15 MED ORDER — AZITHROMYCIN 250 MG PO TABS: ORAL_TABLET | ORAL | 0 refills | Status: DC

## 2022-03-15 NOTE — ED Triage Notes (Signed)
Patient c/o cough, sore throat and bilateral ear pain x 5 days. Patient has taken Vitamin C, DayQuil, Nyquil and Zyrtec.

## 2022-03-15 NOTE — Discharge Instructions (Addendum)
  Make sure you are drinking lots of water May use over-the-counter cough and cold medicines as needed Consider Flonase for the nasal congestion and ear pain  If you fail to improve over the next couple of days, or if your symptoms worsen, you may fill and take the azithromycin

## 2022-03-15 NOTE — ED Provider Notes (Signed)
[facility]    CSN: [id number] Arrival date & time: 03/15/22  1040      History   Chief Complaint Chief Complaint  Patient presents with   Cough    HPI Kimberly Kirk is a 37 y.o. female.   HPI  Patient's had a runny nose, cough, sore throat, ear pain for the last 5 days.  She states her symptoms are getting worse.  It is painful to swallow.  No sputum production.  Runny nose is clear mucus.  She is taking over-the-counter medicines.  No fever or chills, no body aches.  Past Medical History:  Diagnosis Date   Medical history non-contributory    Vaginal Pap smear, abnormal    HPV    Patient Active Problem List   Diagnosis Date Noted   Pregnant 05/17/2018   S/P cesarean section 05/17/2018   Cholecystitis 04/04/2018    Past Surgical History:  Procedure Laterality Date   CESAREAN SECTION     x 2   CESAREAN SECTION WITH BILATERAL TUBAL LIGATION N/A 05/17/2018   Procedure: CESAREAN SECTION WITH BILATERAL TUBAL LIGATION;  Surgeon: Tyson Dense, MD;  Location: [facility];  Service: Obstetrics;  Laterality: N/A;   CHOLECYSTECTOMY N/A 04/06/2018   Procedure: LAPAROSCOPIC CHOLECYSTECTOMY;  Surgeon: Coralie Keens, MD;  Location: [facility];  Service: General;  Laterality: N/A;   excision of vulvar cysts     2013   TONSILLECTOMY     WISDOM TOOTH EXTRACTION      OB History     Gravida  3   Para  3   Term  3   Preterm      AB      Living  3      SAB      IAB      Ectopic      Multiple  0   Live Births  3            Home Medications    Prior to Admission medications   Medication Sig Start Date End Date Taking? Authorizing Provider  busPIRone (BUSPAR) 5 MG tablet Take 5 mg by mouth 3 (three) times daily.   Yes [provider]  loratadine (CLARITIN) 10 MG tablet Take 10 mg by mouth daily.   Yes [provider]  zolpidem (AMBIEN) 10 MG tablet Take by mouth at bedtime as needed for sleep.   Yes [provider]  azithromycin (ZITHROMAX Z-PAK) 250 MG tablet Take two pills today followed by one a day until gone 03/15/22  Yes Meda Coffee Jennette Banker, MD    Family History Family History  Problem Relation Age of Onset   Healthy Mother    Healthy Father    Diabetes Other     Social History Social History   Tobacco Use   Smoking status: Never   Smokeless tobacco: Never  Vaping Use   Vaping Use: Never used  Substance Use Topics   Alcohol use: Yes    Alcohol/week: 2.0 standard drinks of alcohol    Types: 2 Standard drinks or equivalent per week   Drug use: No     Allergies   Patient has no known allergies.   Review of Systems Review of Systems See HPI  Physical Exam Triage Vital Signs ED Triage Vitals  Enc Vitals Group     BP 03/15/22 1053 109/67     Pulse Rate 03/15/22 1053 93     Resp 03/15/22 1053 16     Temp  03/15/22 1053 98.6 F (37 C)     Temp Source 03/15/22 1053 Oral     SpO2 03/15/22 1053 97 %     Weight 03/15/22 1055 230 lb (104.3 kg)     Height 03/15/22 1055 '5\' 7"'$  (1.702 m)     Head Circumference --      Peak Flow --      Pain Score 03/15/22 1055 3     Pain Loc --      Pain Edu? --      Excl. in [place]? --    No data found.  Updated Vital Signs BP 109/67 (BP Location: Right Arm)   Pulse 93   Temp 98.6 F (37 C) (Oral)   Resp 16   Ht '5\' 7"'$  (1.702 m)   Wt 104.3 kg   SpO2 97%   BMI 36.02 kg/m      Physical Exam Constitutional:      General: She is not in acute distress.    Appearance: She is well-developed. She is not ill-appearing.  HENT:     Head: Normocephalic and atraumatic.     Right Ear: Tympanic membrane and ear canal normal.     Left Ear: Tympanic membrane and ear canal normal.     Nose: Nose normal. No congestion.     Mouth/Throat:     Pharynx: Posterior oropharyngeal erythema present. No oropharyngeal exudate.     Comments: Patchy erythematous spots on soft palate.  No ulcerations Eyes:     Conjunctiva/sclera: Conjunctivae  normal.     Pupils: Pupils are equal, round, and reactive to light.  Cardiovascular:     Rate and Rhythm: Normal rate and regular rhythm.     Heart sounds: Normal heart sounds.  Pulmonary:     Effort: Pulmonary effort is normal. No respiratory distress.     Breath sounds: Normal breath sounds.  Abdominal:     General: There is no distension.     Palpations: Abdomen is soft.  Musculoskeletal:        General: Normal range of motion.     Cervical back: Normal range of motion.  Lymphadenopathy:     Cervical: No cervical adenopathy.  Skin:    General: Skin is warm and dry.  Neurological:     Mental Status: She is alert.      UC Treatments / Results  Labs (all labs ordered are listed, but only abnormal results are displayed) Labs Reviewed - No data to display  EKG   Radiology No results found.  Procedures Procedures (including critical care time)  Medications Ordered in UC Medications - No data to display  Initial Impression / Assessment and Plan / UC Course  I have reviewed the triage vital signs and the nursing notes.  Pertinent labs & imaging results that were available during my care of the patient were reviewed by me and considered in my medical decision making (see chart for details).     Discussed this is likely a viral illness.  Would give it more time to get better.  If fails to improve can fill and take azithromycin Final Clinical Impressions(s) / UC Diagnoses   Final diagnoses:  Viral URI with cough     Discharge Instructions       Make sure you are drinking lots of water May use over-the-counter cough and cold medicines as needed Consider Flonase for the nasal congestion and ear pain  If you fail to improve over the next couple of days, or if  your symptoms worsen, you may fill and take the azithromycin    ED Prescriptions     Medication Sig Dispense Auth. Provider   azithromycin (ZITHROMAX Z-PAK) 250 MG tablet Take two pills today followed  by one a day until gone 6 tablet Meda Coffee Jennette Banker, MD      PDMP not reviewed this encounter.   Raylene Everts, MD 03/15/22 [phone]

## 2022-10-31 ENCOUNTER — Ambulatory Visit
Admission: RE | Admit: 2022-10-31 | Discharge: 2022-10-31 | Disposition: A | Discharge status: Home / Self Care | Payer: BC Managed Care – PPO | Source: Ambulatory Visit | Attending: Family Medicine | Admitting: Family Medicine

## 2022-10-31 VITALS — BP 115/82 | HR 92 | Temp 97.6°F | Resp 18 | Ht 67.0 in | Wt 230.0 lb

## 2022-10-31 DIAGNOSIS — R051 Acute cough: Secondary | ICD-10-CM | POA: Diagnosis not present

## 2022-10-31 DIAGNOSIS — J22 Unspecified acute lower respiratory infection: Secondary | ICD-10-CM

## 2022-10-31 MED ORDER — AMOXICILLIN-POT CLAVULANATE 875-125 MG PO TABS: 1.0000 | ORAL_TABLET | Freq: Two times a day (BID) | ORAL | 0 refills | Status: DC

## 2022-10-31 MED ORDER — PREDNISONE 50 MG PO TABS: ORAL_TABLET | ORAL | 0 refills | Status: DC

## 2022-10-31 NOTE — ED Provider Notes (Signed)
[name]    CSN: [id number] Arrival date & time: 10/31/22  0911      History   Chief Complaint Chief Complaint  Patient presents with   Cough    Cough, congestion, ear pain, not feeling good. Very tired. - Entered by patient    HPI Kimberly Kirk is a 37 y.o. female.   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  She is exposed to a lot of infections.  She has had cough congestion ear pain and not feeling well going on 2 weeks.  She states that the cough is keeping her awake at night.  She feels like her sinuses are congested no fever chills or bodyaches noted.  She is taking over-the-counter DayQuil and NyQuil with symptom relief    Past Medical History:  Diagnosis Date   Medical history non-contributory    Vaginal Pap smear, abnormal    HPV    Patient Active Problem List   Diagnosis Date Noted   Pregnant 05/17/2018   S/P cesarean section 05/17/2018   Cholecystitis 04/04/2018    Past Surgical History:  Procedure Laterality Date   CESAREAN SECTION     x 2   CESAREAN SECTION WITH BILATERAL TUBAL LIGATION N/A 05/17/2018   Procedure: CESAREAN SECTION WITH BILATERAL TUBAL LIGATION;  Surgeon: Ranae Pila, MD;  Location: [facility];  Service: Obstetrics;  Laterality: N/A;   CHOLECYSTECTOMY N/A 04/06/2018   Procedure: LAPAROSCOPIC CHOLECYSTECTOMY;  Surgeon: Abigail Miyamoto, MD;  Location: [facility];  Service: General;  Laterality: N/A;   excision of vulvar cysts     2013   TONSILLECTOMY     WISDOM TOOTH EXTRACTION      OB History     Gravida  3   Para  3   Term  3   Preterm      AB      Living  3      SAB      IAB      Ectopic      Multiple  0   Live Births  3            Home Medications    Prior to Admission medications   Medication Sig Start Date End Date Taking? Authorizing Provider  amoxicillin-clavulanate (AUGMENTIN) 875-125 MG tablet Take 1 tablet by mouth every 12 (twelve) hours. 10/31/22  Yes Eustace Moore, MD  busPIRone (BUSPAR) 5 MG tablet Take 5 mg by mouth 3 (three) times daily.   Yes [provider]  loratadine (CLARITIN) 10 MG tablet Take 10 mg by mouth daily.   Yes [provider]  predniSONE (DELTASONE) 50 MG tablet Take once a day for 5 days.  Take with food 10/31/22  Yes Eustace Moore, MD  zolpidem (AMBIEN) 10 MG tablet Take by mouth at bedtime as needed for sleep.   Yes [provider]    Family History Family History  Problem Relation Age of Onset   Healthy Mother    Healthy Father    Diabetes Other     Social History Social History   Tobacco Use   Smoking status: Never   Smokeless tobacco: Never  Vaping Use   Vaping status: Never Used  Substance Use Topics   Alcohol use: Yes    Alcohol/week: 2.0 standard drinks of alcohol    Types: 2 Standard drinks or equivalent per week   Drug use: No     Allergies   Patient has  no known allergies.   Review of Systems Review of Systems See HPI  Physical Exam Triage Vital Signs ED Triage Vitals  Encounter Vitals Group     BP 10/31/22 0923 115/82     Systolic BP Percentile --      Diastolic BP Percentile --      Pulse Rate 10/31/22 0923 92     Resp 10/31/22 0923 18     Temp 10/31/22 0923 97.6 F (36.4 C)     Temp Source 10/31/22 0923 Oral     SpO2 10/31/22 0923 98 %     Weight 10/31/22 0924 230 lb (104.3 kg)     Height 10/31/22 0924 5\' 7"  (1.702 m)     Head Circumference --      Peak Flow --      Pain Score 10/31/22 0924 1     Pain Loc --      Pain Education --      Exclude from Growth Chart --    No data found.  Updated Vital Signs BP 115/82 (BP Location: Right Arm)   Pulse 92   Temp 97.6 F (36.4 C) (Oral)   Resp 18   Ht 5\' 7"  (1.702 m)   Wt 104.3 kg   SpO2 98%   BMI 36.02 kg/m       Physical Exam Constitutional:      General: She is not in acute distress.    Appearance: She is well-developed. She is ill-appearing.  HENT:     Head: Normocephalic  and atraumatic.     Right Ear: Tympanic membrane and ear canal normal.     Left Ear: Tympanic membrane and ear canal normal.     Nose: Congestion and rhinorrhea present.     Mouth/Throat:     Pharynx: No posterior oropharyngeal erythema.  Eyes:     Conjunctiva/sclera: Conjunctivae normal.     Pupils: Pupils are equal, round, and reactive to light.  Cardiovascular:     Rate and Rhythm: Normal rate and regular rhythm.     Heart sounds: Normal heart sounds.  Pulmonary:     Effort: Pulmonary effort is normal. No respiratory distress.     Breath sounds: Rhonchi present.  Abdominal:     General: There is no distension.     Palpations: Abdomen is soft.  Musculoskeletal:        General: Normal range of motion.     Cervical back: Normal range of motion.  Lymphadenopathy:     Cervical: Cervical adenopathy present.  Skin:    General: Skin is warm and dry.  Neurological:     Mental Status: She is alert.      UC Treatments / Results  Labs (all labs ordered are listed, but only abnormal results are displayed) Labs Reviewed - No data to display  EKG   Radiology No results found.  Procedures Procedures (including critical care time)  Medications Ordered in UC Medications - No data to display  Initial Impression / Assessment and Plan / UC Course  I have reviewed the triage vital signs and the nursing notes.  Pertinent labs & imaging results that were available during my care of the patient were reviewed by me and considered in my medical decision making (see chart for details).     Final Clinical Impressions(s) / UC Diagnoses   Final diagnoses:  LRTI (lower respiratory tract infection)  Acute cough     Discharge Instructions      Take the Augmentin 2 times  a day.  Take this medicine with food If it upsets your stomach you might need a probiotic with antibiotic Take the prednisone once a day for 5 days.  This helps with the congestion and drainage Make sure you  continue to drink lots of water May use the NyQuil at night See your doctor if not improving by next week   ED Prescriptions     Medication Sig Dispense Auth. Provider   amoxicillin-clavulanate (AUGMENTIN) 875-125 MG tablet Take 1 tablet by mouth every 12 (twelve) hours. 14 tablet Eustace Moore, MD   predniSONE (DELTASONE) 50 MG tablet Take once a day for 5 days.  Take with food 5 tablet Eustace Moore, MD      PDMP not reviewed this encounter.   Eustace Moore, MD 10/31/22 [phone]

## 2022-10-31 NOTE — ED Triage Notes (Signed)
Patient c/o cough, congestion, winded easy, just doesn't feel good x 1 week.  Low grade fever.  Patient has taken Dayquil, Nyquil, Vitamin C and Ibuprofen.

## 2022-10-31 NOTE — Discharge Instructions (Signed)
Take the Augmentin 2 times a day.  Take this medicine with food If it upsets your stomach you might need a probiotic with antibiotic Take the prednisone once a day for 5 days.  This helps with the congestion and drainage Make sure you continue to drink lots of water May use the NyQuil at night See your doctor if not improving by next week

## 2023-02-02 ENCOUNTER — Ambulatory Visit
Admission: RE | Admit: 2023-02-02 | Discharge: 2023-02-02 | Disposition: A | Discharge status: Home / Self Care | Payer: BC Managed Care – PPO | Source: Ambulatory Visit | Attending: Family Medicine | Admitting: Family Medicine

## 2023-02-02 ENCOUNTER — Other Ambulatory Visit: Payer: Self-pay

## 2023-02-02 VITALS — BP 115/79 | HR 111 | Temp 99.2°F | Resp 16

## 2023-02-02 DIAGNOSIS — R509 Fever, unspecified: Secondary | ICD-10-CM

## 2023-02-02 DIAGNOSIS — J069 Acute upper respiratory infection, unspecified: Secondary | ICD-10-CM | POA: Diagnosis not present

## 2023-02-02 LAB — POC COVID19/FLU A&B COMBO
Covid Antigen, POC: NEGATIVE
Influenza A Antigen, POC: NEGATIVE
Influenza B Antigen, POC: NEGATIVE

## 2023-02-02 MED ORDER — BENZONATATE 200 MG PO CAPS: 200.0000 mg | ORAL_CAPSULE | Freq: Three times a day (TID) | ORAL | 0 refills | Status: AC | PRN

## 2023-02-02 MED ORDER — PROMETHAZINE-DM 6.25-15 MG/5ML PO SYRP: 5.0000 mL | ORAL_SOLUTION | Freq: Two times a day (BID) | ORAL | 0 refills | Status: DC | PRN

## 2023-02-02 NOTE — Discharge Instructions (Addendum)
Patient may take Tessalon capsules daily or as needed for cough.  Advised may use Promethazine DM at night for cough prior to sleep due to sedative  effects.  Advised patient all test were negative this morning.  Advised may take OTC Tylenol 1 g every 6 hours for fever (oral temperature greater than 100.3).  Encouraged to increase daily water intake to 64 ounces per day while taking these medications.  Advised if symptoms worsen and/or unresolved please follow-up with PCP or here for further evaluation.

## 2023-02-02 NOTE — ED Triage Notes (Signed)
X 3 days has been sick but worse last night. Has cough, congestion, body aches, chest pain with coughing. Daughter has the flu and was around someone who had covid recently. Has had fever 100.9 this morning. Has taken dayquil and nyquil.

## 2023-02-02 NOTE — ED Provider Notes (Signed)
[name]    CSN: [id number] Arrival date & time: 02/02/23  0854      History   Chief Complaint Chief Complaint  Patient presents with   Fever    HPI Kimberly Kirk is a 38 y.o. female.   HPI  Past Medical History:  Diagnosis Date   Medical history non-contributory    Vaginal Pap smear, abnormal    HPV    Patient Active Problem List   Diagnosis Date Noted   Pregnant 05/17/2018   S/P cesarean section 05/17/2018   Cholecystitis 04/04/2018    Past Surgical History:  Procedure Laterality Date   CESAREAN SECTION     x 2   CESAREAN SECTION WITH BILATERAL TUBAL LIGATION N/A 05/17/2018   Procedure: CESAREAN SECTION WITH BILATERAL TUBAL LIGATION;  Surgeon: Ranae Pila, MD;  Location: [facility];  Service: Obstetrics;  Laterality: N/A;   CHOLECYSTECTOMY N/A 04/06/2018   Procedure: LAPAROSCOPIC CHOLECYSTECTOMY;  Surgeon: Abigail Miyamoto, MD;  Location: [facility];  Service: General;  Laterality: N/A;   excision of vulvar cysts     2013   TONSILLECTOMY     WISDOM TOOTH EXTRACTION      OB History     Gravida  3   Para  3   Term  3   Preterm      AB      Living  3      SAB      IAB      Ectopic      Multiple  0   Live Births  3            Home Medications    Prior to Admission medications   Medication Sig Start Date End Date Taking? Authorizing Provider  benzonatate (TESSALON) 200 MG capsule Take 1 capsule (200 mg total) by mouth 3 (three) times daily as needed for up to 7 days. 02/02/23 02/09/23 Yes Trevor Iha, FNP  promethazine-dextromethorphan (PROMETHAZINE-DM) 6.25-15 MG/5ML syrup Take 5 mLs by mouth 2 (two) times daily as needed for cough. 02/02/23  Yes Trevor Iha, FNP  amoxicillin-clavulanate (AUGMENTIN) 875-125 MG tablet Take 1 tablet by mouth every 12 (twelve) hours. 10/31/22   Eustace Moore, MD  busPIRone (BUSPAR) 5 MG tablet Take 5 mg by mouth 3 (three) times daily.    [provider]  loratadine  (CLARITIN) 10 MG tablet Take 10 mg by mouth daily.    [provider]  predniSONE (DELTASONE) 50 MG tablet Take once a day for 5 days.  Take with food 10/31/22   Eustace Moore, MD  zolpidem (AMBIEN) 10 MG tablet Take by mouth at bedtime as needed for sleep.    [provider]    Family History Family History  Problem Relation Age of Onset   Healthy Mother    Healthy Father    Diabetes Other     Social History Social History   Tobacco Use   Smoking status: Never   Smokeless tobacco: Never  Vaping Use   Vaping status: Never Used  Substance Use Topics   Alcohol use: Yes    Alcohol/week: 2.0 standard drinks of alcohol    Types: 2 Standard drinks or equivalent per week   Drug use: No     Allergies   Patient has no known allergies.   Review of Systems Review of Systems  Constitutional:  Positive for fever.  All other systems reviewed and are negative.    Physical Exam Triage Vital Signs  ED Triage Vitals  Encounter Vitals Group     BP 02/02/23 0930 115/79     Systolic BP Percentile --      Diastolic BP Percentile --      Pulse Rate 02/02/23 0930 (!) 111     Resp 02/02/23 0930 16     Temp 02/02/23 0930 99.2 F (37.3 C)     Temp src --      SpO2 02/02/23 0930 98 %     Weight --      Height --      Head Circumference --      Peak Flow --      Pain Score 02/02/23 0933 2     Pain Loc --      Pain Education --      Exclude from Growth Chart --    No data found.  Updated Vital Signs BP 115/79   Pulse (!) 111   Temp 99.2 F (37.3 C)   Resp 16   SpO2 98%    Physical Exam Vitals and nursing note reviewed.  Constitutional:      Appearance: Normal appearance. She is obese. She is ill-appearing.  HENT:     Head: Normocephalic and atraumatic.     Right Ear: Tympanic membrane, ear canal and external ear normal.     Left Ear: Ear canal and external ear normal.     Mouth/Throat:     Mouth: Mucous membranes are moist.     Pharynx:  Oropharynx is clear.  Eyes:     Extraocular Movements: Extraocular movements intact.     Conjunctiva/sclera: Conjunctivae normal.     Pupils: Pupils are equal, round, and reactive to light.  Cardiovascular:     Rate and Rhythm: Normal rate and regular rhythm.     Pulses: Normal pulses.     Heart sounds: Normal heart sounds.  Pulmonary:     Effort: Pulmonary effort is normal.     Breath sounds: Normal breath sounds. No wheezing, rhonchi or rales.  Musculoskeletal:        General: Normal range of motion.     Cervical back: Normal range of motion and neck supple.  Skin:    General: Skin is warm and dry.  Neurological:     General: No focal deficit present.     Mental Status: She is oriented to person, place, and time. Mental status is at baseline.  Psychiatric:        Mood and Affect: Mood normal.        Behavior: Behavior normal.      UC Treatments / Results  Labs (all labs ordered are listed, but only abnormal results are displayed) Labs Reviewed  POC COVID19/FLU A&B COMBO    EKG   Radiology No results found.  Procedures Procedures (including critical care time)  Medications Ordered in UC Medications - No data to display  Initial Impression / Assessment and Plan / UC Course  I have reviewed the triage vital signs and the nursing notes.  Pertinent labs & imaging results that were available during my care of the patient were reviewed by me and considered in my medical decision making (see chart for details).     MDM: 1.  Viral URI with cough-Rx'd Tessalon 200 mg capsule: Take 1 capsule 3 times daily, as needed for cough, Rx'd Promethazine DM 6.25-15 mg / 5 mL syrup: Take 5 mL every 6 hours, as needed for cough. Patient may take Tessalon capsules daily or as needed  for cough.  Advised may use Promethazine DM at night for cough prior to sleep due to sedative  effects.  Advised patient all test were negative this morning.  Advised may take OTC Tylenol 1 g every 6 hours  for fever (oral temperature greater than 100.3).  Encouraged to increase daily water intake to 64 ounces per day while taking these medications.  Advised if symptoms worsen and/or unresolved please follow-up with PCP or here for further evaluation.  Patient discharged home, hemodynamically stable. Final Clinical Impressions(s) / UC Diagnoses   Final diagnoses:  Fever, unspecified  Viral URI with cough     Discharge Instructions      Patient may take Tessalon capsules daily or as needed for cough.  Advised may use Promethazine DM at night for cough prior to sleep due to sedative  effects.  Advised patient all test were negative this morning.  Advised may take OTC Tylenol 1 g every 6 hours for fever (oral temperature greater than 100.3).  Encouraged to increase daily water intake to 64 ounces per day while taking these medications.  Advised if symptoms worsen and/or unresolved please follow-up with PCP or here for further evaluation.     ED Prescriptions     Medication Sig Dispense Auth. Provider   benzonatate (TESSALON) 200 MG capsule Take 1 capsule (200 mg total) by mouth 3 (three) times daily as needed for up to 7 days. 40 capsule Trevor Iha, FNP   promethazine-dextromethorphan (PROMETHAZINE-DM) 6.25-15 MG/5ML syrup Take 5 mLs by mouth 2 (two) times daily as needed for cough. 118 mL Trevor Iha, FNP      PDMP not reviewed this encounter.   Trevor Iha, FNP 02/02/23 1026

## 2023-09-01 ENCOUNTER — Ambulatory Visit
Admission: RE | Admit: 2023-09-01 | Discharge: 2023-09-01 | Disposition: A | Discharge status: Home / Self Care | Source: Ambulatory Visit | Attending: Family Medicine | Admitting: Family Medicine

## 2023-09-01 VITALS — BP 112/76 | HR 88 | Temp 97.5°F | Resp 17

## 2023-09-01 DIAGNOSIS — H6692 Otitis media, unspecified, left ear: Secondary | ICD-10-CM

## 2023-09-01 DIAGNOSIS — H9202 Otalgia, left ear: Secondary | ICD-10-CM | POA: Diagnosis not present

## 2023-09-01 MED ORDER — PREDNISONE 20 MG PO TABS: ORAL_TABLET | ORAL | 0 refills | Status: AC

## 2023-09-01 MED ORDER — AMOXICILLIN-POT CLAVULANATE 875-125 MG PO TABS: 1.0000 | ORAL_TABLET | Freq: Two times a day (BID) | ORAL | 0 refills | Status: AC

## 2023-09-01 NOTE — Discharge Instructions (Addendum)
 Advised patient to take medications as directed with food to completion.  Advised patient to take prednisone  with first dose of Augmentin  for the next 5 of 10 days.  Encouraged increase daily water intake to 64 ounces per day while taking these medications.  Advised if symptoms worsen and/or unresolved please follow-up with your PCP, ENT, or here for further evaluation.

## 2023-09-01 NOTE — ED Triage Notes (Signed)
 Pt c/o LT ear pain x 3 days. Warm compresses and ibuprofen  as needed.

## 2023-09-01 NOTE — ED Provider Notes (Signed)
 [facility]    CSN: [id number] Arrival date & time: 09/01/23  1315      History   Chief Complaint Chief Complaint  Patient presents with   Otalgia    LT    HPI Kimberly Kirk is a 38 y.o. female.   HPI pleasant 38 year old female presents with a left ear pain for 3 days.  PMH significant for obesity and cholecystitis.  Past Medical History:  Diagnosis Date   Medical history non-contributory    Vaginal Pap smear, abnormal    HPV    Patient Active Problem List   Diagnosis Date Noted   Pregnant 05/17/2018   S/P cesarean section 05/17/2018   Cholecystitis 04/04/2018    Past Surgical History:  Procedure Laterality Date   CESAREAN SECTION     x 2   CESAREAN SECTION WITH BILATERAL TUBAL LIGATION N/A 05/17/2018   Procedure: CESAREAN SECTION WITH BILATERAL TUBAL LIGATION;  Surgeon: Marne Kelly Nest, MD;  Location: [facility];  Service: Obstetrics;  Laterality: N/A;   CHOLECYSTECTOMY N/A 04/06/2018   Procedure: LAPAROSCOPIC CHOLECYSTECTOMY;  Surgeon: Vernetta Berg, MD;  Location: [facility];  Service: General;  Laterality: N/A;   excision of vulvar cysts     2013   TONSILLECTOMY     WISDOM TOOTH EXTRACTION      OB History     Gravida  3   Para  3   Term  3   Preterm      AB      Living  3      SAB      IAB      Ectopic      Multiple  0   Live Births  3            Home Medications    Prior to Admission medications   Medication Sig Start Date End Date Taking? Authorizing Provider  amoxicillin -clavulanate (AUGMENTIN ) 875-125 MG tablet Take 1 tablet by mouth 2 (two) times daily for 10 days. 09/01/23 09/11/23 Yes Teddy Sharper, FNP  predniSONE  (DELTASONE ) 20 MG tablet Take 3 tabs PO daily x 5 days. 09/01/23  Yes Teddy Sharper, FNP  busPIRone (BUSPAR) 5 MG tablet Take 5 mg by mouth 3 (three) times daily.    [provider]  loratadine  (CLARITIN ) 10 MG tablet Take 10 mg by mouth daily.    [provider]  zolpidem   (AMBIEN ) 10 MG tablet Take by mouth at bedtime as needed for sleep.    [provider]    Family History Family History  Problem Relation Age of Onset   Healthy Mother    Healthy Father    Diabetes Other     Social History Social History   Tobacco Use   Smoking status: Never   Smokeless tobacco: Never  Vaping Use   Vaping status: Never Used  Substance Use Topics   Alcohol use: Yes    Alcohol/week: 2.0 standard drinks of alcohol    Types: 2 Standard drinks or equivalent per week   Drug use: No     Allergies   Patient has no known allergies.   Review of Systems Review of Systems  HENT:  Positive for ear discharge.   All other systems reviewed and are negative.    Physical Exam Triage Vital Signs ED Triage Vitals  Encounter Vitals Group     BP      Girls Systolic BP Percentile      Girls Diastolic BP Percentile  Boys Systolic BP Percentile      Boys Diastolic BP Percentile      Pulse      Resp      Temp      Temp src      SpO2      Weight      Height      Head Circumference      Peak Flow      Pain Score      Pain Loc      Pain Education      Exclude from Growth Chart    No data found.  Updated Vital Signs BP 112/76 (BP Location: Right Arm)   Pulse 88   Temp (!) 97.5 F (36.4 C) (Oral)   Resp 17   SpO2 98%    Physical Exam Vitals and nursing note reviewed.  Constitutional:      General: She is not in acute distress.    Appearance: Normal appearance. She is normal weight. She is not ill-appearing.  HENT:     Head: Normocephalic and atraumatic.     Right Ear: Ear canal and external ear normal.     Left Ear: Tympanic membrane, ear canal and external ear normal.     Ears:     Comments: Right TM: Erythematous, bulging    Mouth/Throat:     Mouth: Mucous membranes are moist.     Pharynx: Oropharynx is clear.  Eyes:     Extraocular Movements: Extraocular movements intact.     Conjunctiva/sclera: Conjunctivae normal.      Pupils: Pupils are equal, round, and reactive to light.  Cardiovascular:     Rate and Rhythm: Normal rate and regular rhythm.     Pulses: Normal pulses.     Heart sounds: Normal heart sounds.  Pulmonary:     Effort: Pulmonary effort is normal.     Breath sounds: Normal breath sounds. No wheezing, rhonchi or rales.  Musculoskeletal:        General: Normal range of motion.  Skin:    General: Skin is warm and dry.  Neurological:     General: No focal deficit present.     Mental Status: She is alert and oriented to person, place, and time.  Psychiatric:        Mood and Affect: Mood normal.        Behavior: Behavior normal.      UC Treatments / Results  Labs (all labs ordered are listed, but only abnormal results are displayed) Labs Reviewed - No data to display  EKG   Radiology No results found.  Procedures Procedures (including critical care time)  Medications Ordered in UC Medications - No data to display  Initial Impression / Assessment and Plan / UC Course  I have reviewed the triage vital signs and the nursing notes.  Pertinent labs & imaging results that were available during my care of the patient were reviewed by me and considered in my medical decision making (see chart for details).     MDM: 1.  Acute left otitis media-Rx'd Augmentin  875/125 mg tablet: Take 1 tablet twice daily x 10 days; 2. Otalgia of left ear-Rx'd prednisone  20 mg tablet: Take 3 tablets p.o. daily x 5. Advised patient to take medications as directed with food to completion.  Advised patient to take prednisone  with first dose of Augmentin  for the next 5 of 10 days.  Encouraged increase daily water intake to 64 ounces per day while taking these medications.  Advised if symptoms worsen and/or unresolved please follow-up with your PCP, ENT, or here for further evaluation.  Patient discharged home, hemodynamically stable. Final Clinical Impressions(s) / UC Diagnoses   Final diagnoses:  Otalgia of  left ear  Acute left otitis media     Discharge Instructions      Advised patient to take medications as directed with food to completion.  Advised patient to take prednisone  with first dose of Augmentin  for the next 5 of 10 days.  Encouraged increase daily water intake to 64 ounces per day while taking these medications.  Advised if symptoms worsen and/or unresolved please follow-up with your PCP, ENT, or here for further evaluation.     ED Prescriptions     Medication Sig Dispense Auth. Provider   amoxicillin -clavulanate (AUGMENTIN ) 875-125 MG tablet Take 1 tablet by mouth 2 (two) times daily for 10 days. 20 tablet Oswaldo Cueto, FNP   predniSONE  (DELTASONE ) 20 MG tablet Take 3 tabs PO daily x 5 days. 15 tablet Nakyia Dau, FNP      PDMP not reviewed this encounter.   Teddy Sharper, FNP 09/01/23 1345

## 2023-09-02 ENCOUNTER — Telehealth: Payer: Self-pay

## 2023-09-02 NOTE — Telephone Encounter (Signed)
 Called to check on patient. Doing better. No needs at this time.
